# Patient Record
Sex: Female | Born: 1960
Health system: Southern US, Community
[De-identification: ages and names within clinical notes are randomized; demographics above are authoritative.]

## PROBLEM LIST (undated history)

## (undated) DIAGNOSIS — M170 Bilateral primary osteoarthritis of knee: Secondary | ICD-10-CM

## (undated) DIAGNOSIS — T4145XA Adverse effect of unspecified anesthetic, initial encounter: Secondary | ICD-10-CM

## (undated) DIAGNOSIS — T8859XA Other complications of anesthesia, initial encounter: Secondary | ICD-10-CM

## (undated) DIAGNOSIS — I456 Pre-excitation syndrome: Secondary | ICD-10-CM

## (undated) HISTORY — PX: COLONOSCOPY: SHX174

## (undated) HISTORY — DX: Bilateral primary osteoarthritis of knee: M17.0

## (undated) HISTORY — PX: DILATION AND CURETTAGE OF UTERUS: SHX78

## (undated) HISTORY — DX: Pre-excitation syndrome: I45.6

---

## 1999-03-23 ENCOUNTER — Emergency Department (HOSPITAL_COMMUNITY): Admission: EM | Admit: 1999-03-23 | Discharge: 1999-03-23 | Payer: Self-pay | Admitting: Emergency Medicine

## 2008-02-10 ENCOUNTER — Other Ambulatory Visit: Admission: RE | Admit: 2008-02-10 | Discharge: 2008-02-10 | Payer: Self-pay | Admitting: Family Medicine

## 2011-05-09 ENCOUNTER — Other Ambulatory Visit: Payer: Self-pay | Admitting: Family Medicine

## 2011-05-09 ENCOUNTER — Other Ambulatory Visit (HOSPITAL_COMMUNITY)
Admission: RE | Admit: 2011-05-09 | Discharge: 2011-05-09 | Disposition: A | Discharge status: Home / Self Care | Payer: 59 | Source: Ambulatory Visit | Attending: Family Medicine | Admitting: Family Medicine

## 2011-05-09 DIAGNOSIS — Z01419 Encounter for gynecological examination (general) (routine) without abnormal findings: Secondary | ICD-10-CM | POA: Insufficient documentation

## 2011-06-14 ENCOUNTER — Other Ambulatory Visit: Payer: Self-pay | Admitting: Family Medicine

## 2011-06-14 DIAGNOSIS — Z1231 Encounter for screening mammogram for malignant neoplasm of breast: Secondary | ICD-10-CM

## 2011-06-29 ENCOUNTER — Ambulatory Visit
Admission: RE | Admit: 2011-06-29 | Discharge: 2011-06-29 | Disposition: A | Discharge status: Home / Self Care | Payer: 59 | Source: Ambulatory Visit | Attending: Family Medicine | Admitting: Family Medicine

## 2011-06-29 DIAGNOSIS — Z1231 Encounter for screening mammogram for malignant neoplasm of breast: Secondary | ICD-10-CM

## 2011-06-30 ENCOUNTER — Other Ambulatory Visit: Payer: Self-pay | Admitting: Gastroenterology

## 2011-07-03 ENCOUNTER — Other Ambulatory Visit: Payer: Self-pay | Admitting: Family Medicine

## 2011-07-03 DIAGNOSIS — R928 Other abnormal and inconclusive findings on diagnostic imaging of breast: Secondary | ICD-10-CM

## 2015-05-21 ENCOUNTER — Other Ambulatory Visit: Payer: Self-pay | Admitting: Family Medicine

## 2015-05-21 ENCOUNTER — Other Ambulatory Visit (HOSPITAL_COMMUNITY)
Admission: RE | Admit: 2015-05-21 | Discharge: 2015-05-21 | Disposition: A | Discharge status: Home / Self Care | Payer: 59 | Source: Ambulatory Visit | Attending: Family Medicine | Admitting: Family Medicine

## 2015-05-21 DIAGNOSIS — Z01419 Encounter for gynecological examination (general) (routine) without abnormal findings: Secondary | ICD-10-CM | POA: Diagnosis not present

## 2015-05-24 LAB — CYTOLOGY - PAP

## 2016-03-18 DIAGNOSIS — Z1231 Encounter for screening mammogram for malignant neoplasm of breast: Secondary | ICD-10-CM | POA: Diagnosis not present

## 2016-04-04 DIAGNOSIS — R0982 Postnasal drip: Secondary | ICD-10-CM | POA: Diagnosis not present

## 2016-04-04 DIAGNOSIS — H93293 Other abnormal auditory perceptions, bilateral: Secondary | ICD-10-CM | POA: Diagnosis not present

## 2016-04-04 DIAGNOSIS — I456 Pre-excitation syndrome: Secondary | ICD-10-CM | POA: Diagnosis not present

## 2016-06-05 ENCOUNTER — Ambulatory Visit (INDEPENDENT_AMBULATORY_CARE_PROVIDER_SITE_OTHER): Payer: 59

## 2016-06-05 ENCOUNTER — Encounter (INDEPENDENT_AMBULATORY_CARE_PROVIDER_SITE_OTHER): Payer: Self-pay | Admitting: Orthopaedic Surgery

## 2016-06-05 ENCOUNTER — Encounter (INDEPENDENT_AMBULATORY_CARE_PROVIDER_SITE_OTHER): Payer: Self-pay

## 2016-06-05 ENCOUNTER — Ambulatory Visit (INDEPENDENT_AMBULATORY_CARE_PROVIDER_SITE_OTHER): Payer: 59 | Admitting: Orthopaedic Surgery

## 2016-06-05 DIAGNOSIS — M1711 Unilateral primary osteoarthritis, right knee: Secondary | ICD-10-CM

## 2016-06-05 MED ORDER — MELOXICAM 7.5 MG PO TABS: 7.5000 mg | ORAL_TABLET | Freq: Two times a day (BID) | ORAL | 2 refills | Status: DC | PRN

## 2016-06-05 NOTE — Progress Notes (Signed)
Office Visit Note   Patient: Kara Rodriguez           Date of Birth: 1960-10-13           MRN: [id number] Visit Date: 06/05/2016              Requested by: No referring provider defined for this encounter. PCP: No PCP Per Patient   Assessment & Plan: Visit Diagnoses:  1. Primary osteoarthritis of right knee     Plan: X-rays show advanced degenerative joint disease of the medial compartment mainly with a chronic PCL deficient knee. We discussed at length about her treatment options including Visco supplementation and total knee replacement. Sounds like her quality of life has significantly deteriorated she is leaning towards knee replacement later this year. For now she would like to try viscous supplementation. Modic was prescribed. Questions encouraged and answered. Total face to face encounter time was greater than 45 minutes and over half of this time was spent in counseling and/or coordination of care.  Follow-Up Instructions: Return if symptoms worsen or fail to improve.   Orders:  Orders Placed This Encounter  Procedures  . XR KNEE 3 VIEW RIGHT   Meds ordered this encounter  Medications  . meloxicam (MOBIC) 7.5 MG tablet    Sig: Take 1 tablet (7.5 mg total) by mouth 2 (two) times daily as needed for pain.    Dispense:  30 tablet    Refill:  2      Procedures: No procedures performed   Clinical Data: No additional findings.   Subjective: Chief Complaint  Patient presents with  . Right Knee - Pain    Patient is a was a 56 year old female who comes in with right medial knee pain that is severe for years. She had a cortisone injection about 3 months ago which helped for 3 weeks and now the pain is worse. She endorses cracking popping locking giving way and worsening pain with increased activity and significantly affecting her ADLs. She has taken Advil with partial relief. She has a prior skiing accident 15 years ago which did not require surgery.  She also has  Wolff-Parkinson-White syndrome    Review of Systems  Constitutional: Negative.   HENT: Negative.   Eyes: Negative.   Respiratory: Negative.   Cardiovascular: Negative.   Endocrine: Negative.   Musculoskeletal: Negative.   Neurological: Negative.   Hematological: Negative.   Psychiatric/Behavioral: Negative.   All other systems reviewed and are negative.    Objective: Vital Signs: There were no vitals taken for this visit.  Physical Exam  Constitutional: She is oriented to person, place, and time. She appears well-developed and well-nourished.  HENT:  Head: Normocephalic and atraumatic.  Eyes: EOM are normal.  Neck: Neck supple.  Pulmonary/Chest: Effort normal.  Abdominal: Soft.  Neurological: She is alert and oriented to person, place, and time.  Skin: Skin is warm. Capillary refill takes less than 2 seconds.  Psychiatric: She has a normal mood and affect. Her behavior is normal. Judgment and thought content normal.  Nursing note and vitals reviewed.   Ortho Exam Right knee exam shows no joint effusion. She has excellent range of motion. She does have medial joint line tenderness collaterals and cruciates are stable. She does have a posterior sag sign. Specialty Comments:  No specialty comments available.  Imaging: Xr Knee 3 View Right  Result Date: 06/05/2016 Advanced degenerative joint disease primarily in the medial compartment with chronic posterior subluxation of tibia consistent with chronic  PCL tear    PMFS History: There are no active problems to display for this patient.  No past medical history on file.  No family history on file.  No past surgical history on file. Social History   Occupational History  . Not on file.   Social History Main Topics  . Smoking status: Never Smoker  . Smokeless tobacco: Never Used  . Alcohol use Not on file  . Drug use: Unknown  . Sexual activity: Not on file

## 2016-06-07 ENCOUNTER — Telehealth (INDEPENDENT_AMBULATORY_CARE_PROVIDER_SITE_OTHER): Payer: Self-pay | Admitting: *Deleted

## 2016-06-07 NOTE — Telephone Encounter (Signed)
Pt asking if the pt can have Cortizone inj before she goes out of town tomorrow before the Synvisc inj.

## 2016-06-08 ENCOUNTER — Ambulatory Visit (INDEPENDENT_AMBULATORY_CARE_PROVIDER_SITE_OTHER): Payer: 59 | Admitting: Orthopedic Surgery

## 2016-06-08 ENCOUNTER — Ambulatory Visit (INDEPENDENT_AMBULATORY_CARE_PROVIDER_SITE_OTHER): Payer: 59 | Admitting: Orthopaedic Surgery

## 2016-06-08 DIAGNOSIS — M1711 Unilateral primary osteoarthritis, right knee: Secondary | ICD-10-CM

## 2016-06-08 NOTE — Telephone Encounter (Signed)
Was seen in our office today

## 2016-06-08 NOTE — Progress Notes (Signed)
   Office Visit Note   Patient: Kara Rodriguez           Date of Birth: 1960/10/02           MRN: [id number] Visit Date: 06/08/2016              Requested by: No referring provider defined for this encounter. PCP: No PCP Per Patient   Assessment & Plan: Visit Diagnoses:  1. Primary osteoarthritis of right knee     Plan: Right knee injection was performed without any immediate complications. Follow-up with me as needed. She may call if she desires discussed supplementation.  Follow-Up Instructions: Return if symptoms worsen or fail to improve.   Orders:  No orders of the defined types were placed in this encounter.  No orders of the defined types were placed in this encounter.     Procedures: Large Joint Inj Date/Time: 06/08/2016 11:34 AM Performed by: Leandrew Koyanagi Authorized by: Leandrew Koyanagi   Consent Given by:  Patient Timeout: prior to procedure the correct patient, procedure, and site was verified   Indications:  Pain Location:  Knee Site:  R knee Prep: patient was prepped and draped in usual sterile fashion   Needle Size:  22 G Ultrasound Guidance: No   Fluoroscopic Guidance: No   Arthrogram: No   Patient tolerance:  Patient tolerated the procedure well with no immediate complications     Clinical Data: No additional findings.   Subjective: Chief Complaint  Patient presents with  . Left Ankle - Pain    Patient follows up today for continued right knee pain. She is requesting injection. She continues to have right knee pain.    Review of Systems   Objective: Vital Signs: There were no vitals taken for this visit.  Physical Exam  Ortho Exam Right knee exam is stable. Specialty Comments:  No specialty comments available.  Imaging: No results found.   PMFS History: There are no active problems to display for this patient.  No past medical history on file.  No family history on file.  No past surgical history on file. Social History    Occupational History  . Not on file.   Social History Main Topics  . Smoking status: Never Smoker  . Smokeless tobacco: Never Used  . Alcohol use Not on file  . Drug use: Unknown  . Sexual activity: Not on file

## 2016-06-21 ENCOUNTER — Telehealth (INDEPENDENT_AMBULATORY_CARE_PROVIDER_SITE_OTHER): Payer: Self-pay | Admitting: Orthopaedic Surgery

## 2016-06-21 NOTE — Telephone Encounter (Signed)
Patient called advised she spoke with the monovisc rep and they advised an account need to be set up with them and more information is needed from the provider.  The number to contact patient is [phone]

## 2016-06-22 ENCOUNTER — Telehealth (INDEPENDENT_AMBULATORY_CARE_PROVIDER_SITE_OTHER): Payer: Self-pay | Admitting: Orthopaedic Surgery

## 2016-06-22 NOTE — Telephone Encounter (Signed)
Patient called advised [employer] need the enrollment form with their name listed. The fax# for Dannielle Karvonen is [phone]   The ph# for Dannielle Karvonen  Is [phone]

## 2016-06-22 NOTE — Telephone Encounter (Signed)
Form faxed tuesday

## 2016-06-22 NOTE — Telephone Encounter (Signed)
I will fill out form.

## 2016-06-26 ENCOUNTER — Telehealth (INDEPENDENT_AMBULATORY_CARE_PROVIDER_SITE_OTHER): Payer: Self-pay | Admitting: *Deleted

## 2016-06-26 NOTE — Telephone Encounter (Signed)
Pharmacy called this afternoon in regards to needing a prior-authorization for a medication for this patient. CB # [phone] option # 2. Thank you

## 2016-06-26 NOTE — Telephone Encounter (Signed)
Looks like this is a Dr. Erlinda Hong pt. The original message did not say what medication needs prior auth and I called the number x 3 and the line has been busy.

## 2016-06-26 NOTE — Telephone Encounter (Signed)
Form was faxed 4/9 & refaxed today 06/26/16

## 2016-06-27 DIAGNOSIS — M1711 Unilateral primary osteoarthritis, right knee: Secondary | ICD-10-CM | POA: Diagnosis not present

## 2016-06-27 NOTE — Telephone Encounter (Signed)
C[facility]ed [facility] to approve on shipment for Monovisc inj. Scheduled delivery for April 19 th

## 2016-07-03 ENCOUNTER — Ambulatory Visit (INDEPENDENT_AMBULATORY_CARE_PROVIDER_SITE_OTHER): Payer: 59 | Admitting: Orthopaedic Surgery

## 2016-07-03 DIAGNOSIS — M1711 Unilateral primary osteoarthritis, right knee: Secondary | ICD-10-CM

## 2016-07-03 MED ORDER — HYALURONAN 88 MG/4ML IX SOSY
88.0000 mg | PREFILLED_SYRINGE | INTRA_ARTICULAR | Status: AC | PRN
  Administered 2016-07-03: 88 mg via INTRA_ARTICULAR

## 2016-07-03 NOTE — Progress Notes (Signed)
   Procedure Note  Patient: Kara Rodriguez             Date of Birth: April 28, 1960           MRN: [id number]             Visit Date: 07/03/2016  Procedures: Visit Diagnoses: Primary osteoarthritis of right knee  Large Joint Inj Date/Time: 07/03/2016 4:41 PM Performed by: Leandrew Koyanagi Authorized by: Leandrew Koyanagi   Consent Given by:  Patient Timeout: prior to procedure the correct patient, procedure, and site was verified   Indications:  Pain Location:  Knee Site:  R knee Prep: patient was prepped and draped in usual sterile fashion   Needle Size:  22 G Approach:  Anterolateral Ultrasound Guidance: No   Fluoroscopic Guidance: No   Arthrogram: No   Medications:  88 mg Hyaluronan 88 MG/4ML

## 2016-07-06 ENCOUNTER — Telehealth (INDEPENDENT_AMBULATORY_CARE_PROVIDER_SITE_OTHER): Payer: Self-pay | Admitting: Orthopaedic Surgery

## 2016-07-06 NOTE — Telephone Encounter (Signed)
great

## 2016-07-06 NOTE — Telephone Encounter (Signed)
SEE MESSAGE

## 2016-07-06 NOTE — Telephone Encounter (Signed)
Pt called to inform that her knee is much more stable and feels much better because its more stable. Juluis Rainier  [phone]

## 2017-03-02 ENCOUNTER — Telehealth (INDEPENDENT_AMBULATORY_CARE_PROVIDER_SITE_OTHER): Payer: Self-pay | Admitting: Orthopaedic Surgery

## 2017-03-02 NOTE — Telephone Encounter (Signed)
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 She finally can go to [place] where the pt has to climb 10 to 8 story tower.Pt wanted to say thank to you and everyone in the office. Have a merry christmas and a happy new year

## 2017-03-02 NOTE — Telephone Encounter (Signed)
Made an error discard should go to [employer]

## 2017-03-07 NOTE — Telephone Encounter (Signed)
See message below °

## 2017-03-21 DIAGNOSIS — Z1231 Encounter for screening mammogram for malignant neoplasm of breast: Secondary | ICD-10-CM | POA: Diagnosis not present

## 2017-03-23 DIAGNOSIS — N889 Noninflammatory disorder of cervix uteri, unspecified: Secondary | ICD-10-CM | POA: Diagnosis not present

## 2017-03-23 DIAGNOSIS — N920 Excessive and frequent menstruation with regular cycle: Secondary | ICD-10-CM | POA: Diagnosis not present

## 2017-03-23 DIAGNOSIS — N760 Acute vaginitis: Secondary | ICD-10-CM | POA: Diagnosis not present

## 2017-04-25 ENCOUNTER — Other Ambulatory Visit: Payer: Self-pay | Admitting: Obstetrics & Gynecology

## 2017-04-25 DIAGNOSIS — N841 Polyp of cervix uteri: Secondary | ICD-10-CM | POA: Diagnosis not present

## 2017-04-26 ENCOUNTER — Telehealth (INDEPENDENT_AMBULATORY_CARE_PROVIDER_SITE_OTHER): Payer: Self-pay | Admitting: Orthopaedic Surgery

## 2017-04-26 NOTE — Telephone Encounter (Signed)
Submitted application for bilateral knee injection for [employer].

## 2017-04-26 NOTE — Telephone Encounter (Signed)
Patient called requesting bilateral monovisc injections in her knees, April talked to the patient and told her she would go ahead and submit the request for authorization. She was last seen in April 2018 for the injection in one of her knees. Now she would like both. FYI her appt is made for 05/21/17.

## 2017-05-01 ENCOUNTER — Telehealth (INDEPENDENT_AMBULATORY_CARE_PROVIDER_SITE_OTHER): Payer: Self-pay

## 2017-05-01 NOTE — Telephone Encounter (Signed)
Talked with Girard Cooter (pharmacist) with [facility] for Rx for bilateral knee Monovisc injection.

## 2017-05-01 NOTE — Telephone Encounter (Deleted)
Patient approved for SynviscOne injection left knee..  Authorized 04/26/17 - 04/26/2018.  Reference #[id number]

## 2017-05-18 ENCOUNTER — Telehealth (INDEPENDENT_AMBULATORY_CARE_PROVIDER_SITE_OTHER): Payer: Self-pay | Admitting: Orthopaedic Surgery

## 2017-05-18 ENCOUNTER — Telehealth (INDEPENDENT_AMBULATORY_CARE_PROVIDER_SITE_OTHER): Payer: Self-pay

## 2017-05-18 NOTE — Telephone Encounter (Signed)
Deleted Note due to an error

## 2017-05-18 NOTE — Telephone Encounter (Signed)
Message Sent in Error 

## 2017-05-18 NOTE — Telephone Encounter (Signed)
Called and left VM for patient to return my call concerning Monovisc Injection.

## 2017-05-18 NOTE — Telephone Encounter (Signed)
Please give patient a call back # [phone]

## 2017-05-18 NOTE — Telephone Encounter (Signed)
Talked with patient and advised her that a PA was needed for [employer].  Advised her that I had spoken with [employer] and that a form would be sent for me to complete and fax back.  PA form has been completed and faxed to [phone].

## 2017-05-21 ENCOUNTER — Ambulatory Visit (INDEPENDENT_AMBULATORY_CARE_PROVIDER_SITE_OTHER): Payer: 59 | Admitting: Orthopaedic Surgery

## 2017-05-21 ENCOUNTER — Telehealth (INDEPENDENT_AMBULATORY_CARE_PROVIDER_SITE_OTHER): Payer: Self-pay

## 2017-05-21 NOTE — Telephone Encounter (Signed)
Talked with patient and advised her that PA was denied due to product being excluded under the pharmacy benefit, but advised her that I talked with [facility] and they were needing her consent.  Gave patient number to call to give consent.  [phone]).

## 2017-05-22 ENCOUNTER — Telehealth (INDEPENDENT_AMBULATORY_CARE_PROVIDER_SITE_OTHER): Payer: Self-pay

## 2017-05-22 ENCOUNTER — Telehealth (INDEPENDENT_AMBULATORY_CARE_PROVIDER_SITE_OTHER): Payer: Self-pay | Admitting: Orthopaedic Surgery

## 2017-05-22 NOTE — Telephone Encounter (Signed)
Correction: It will be delivered lunch time Thursday March 14th

## 2017-05-22 NOTE — Telephone Encounter (Signed)
Talked with Verdene Lennert, nurse case manager with [facility] concerning patient's Monovisc injection.  Advised her that patient needed to call [facility] to give her consent for Monovisc.  Provided nurse case manager with number for [facility] which is [phone].

## 2017-05-22 NOTE — Telephone Encounter (Signed)
NOTED

## 2017-05-22 NOTE — Telephone Encounter (Signed)
Patients delivery for Monovisc will be here on March 20th around lunch.

## 2017-05-22 NOTE — Telephone Encounter (Signed)
Patient scheduled for Friday morning with Ria Comment

## 2017-05-23 DIAGNOSIS — M1711 Unilateral primary osteoarthritis, right knee: Secondary | ICD-10-CM | POA: Diagnosis not present

## 2017-05-25 ENCOUNTER — Ambulatory Visit (INDEPENDENT_AMBULATORY_CARE_PROVIDER_SITE_OTHER): Payer: 59 | Admitting: Physician Assistant

## 2017-05-25 ENCOUNTER — Encounter (INDEPENDENT_AMBULATORY_CARE_PROVIDER_SITE_OTHER): Payer: Self-pay | Admitting: Physician Assistant

## 2017-05-25 DIAGNOSIS — M1712 Unilateral primary osteoarthritis, left knee: Secondary | ICD-10-CM | POA: Diagnosis not present

## 2017-05-25 DIAGNOSIS — M1711 Unilateral primary osteoarthritis, right knee: Secondary | ICD-10-CM | POA: Diagnosis not present

## 2017-05-25 DIAGNOSIS — Z0000 Encounter for general adult medical examination without abnormal findings: Secondary | ICD-10-CM | POA: Diagnosis not present

## 2017-05-25 DIAGNOSIS — Z13220 Encounter for screening for lipoid disorders: Secondary | ICD-10-CM | POA: Diagnosis not present

## 2017-05-25 DIAGNOSIS — Z23 Encounter for immunization: Secondary | ICD-10-CM | POA: Diagnosis not present

## 2017-05-25 DIAGNOSIS — M170 Bilateral primary osteoarthritis of knee: Secondary | ICD-10-CM

## 2017-05-25 HISTORY — DX: Bilateral primary osteoarthritis of knee: M17.0

## 2017-05-25 MED ORDER — HYALURONAN 88 MG/4ML IX SOSY
88.0000 mg | PREFILLED_SYRINGE | INTRA_ARTICULAR | Status: AC | PRN
  Administered 2017-05-25: 88 mg via INTRA_ARTICULAR

## 2017-05-25 NOTE — Progress Notes (Signed)
   Procedure Note  Patient: Kara Rodriguez             Date of Birth: 1960/07/11           MRN: [id number]             Visit Date: 05/25/2017  Procedures: Visit Diagnoses: Primary localized osteoarthritis of knees, bilateral  Large Joint Inj: bilateral knee on 05/25/2017 8:32 AM Indications: pain Details: 22 G needle, anterolateral approach Medications (Right): 88 mg Hyaluronan 88 MG/4ML Medications (Left): 88 mg Hyaluronan 88 MG/4ML

## 2017-05-31 DIAGNOSIS — N760 Acute vaginitis: Secondary | ICD-10-CM | POA: Diagnosis not present

## 2017-05-31 DIAGNOSIS — N939 Abnormal uterine and vaginal bleeding, unspecified: Secondary | ICD-10-CM | POA: Diagnosis not present

## 2017-09-04 DIAGNOSIS — D126 Benign neoplasm of colon, unspecified: Secondary | ICD-10-CM | POA: Diagnosis not present

## 2017-09-04 DIAGNOSIS — Z86010 Personal history of colonic polyps: Secondary | ICD-10-CM | POA: Diagnosis not present

## 2017-09-06 DIAGNOSIS — D126 Benign neoplasm of colon, unspecified: Secondary | ICD-10-CM | POA: Diagnosis not present

## 2017-10-19 ENCOUNTER — Telehealth (INDEPENDENT_AMBULATORY_CARE_PROVIDER_SITE_OTHER): Payer: Self-pay | Admitting: Orthopaedic Surgery

## 2017-10-19 NOTE — Telephone Encounter (Signed)
Patient was inquiring about receiving bilateral knee monovisc injections. Her last was done on 3/15. Her husbands insurance is renewing 9/1 so she wants to know if she could get them again before that time. Please advise # [phone]

## 2017-10-19 NOTE — Telephone Encounter (Signed)
I called and spoke with patient. She understands that she cannot have the injections until at least 11/25/17.  She would like to proceed with injections as soon as she is able.   Patient also states that we need to contact [employer] in [place] when we work on authorization. She explains that we went through [place] internationally last time and they could not navigate our phone system to even get in touch with [place] so that she could have the injections.

## 2017-10-22 NOTE — Telephone Encounter (Signed)
Noted  

## 2017-10-22 NOTE — Telephone Encounter (Signed)
See message.

## 2017-10-24 ENCOUNTER — Telehealth (INDEPENDENT_AMBULATORY_CARE_PROVIDER_SITE_OTHER): Payer: Self-pay

## 2017-10-24 NOTE — Telephone Encounter (Signed)
Submitted for VOB, Monovisc, bilateral knee.

## 2017-10-30 ENCOUNTER — Telehealth (INDEPENDENT_AMBULATORY_CARE_PROVIDER_SITE_OTHER): Payer: Self-pay

## 2017-10-30 NOTE — Telephone Encounter (Signed)
Submitted Rx form through [employer] to obtain through [facility] under medical benefits.

## 2017-11-01 ENCOUNTER — Telehealth (INDEPENDENT_AMBULATORY_CARE_PROVIDER_SITE_OTHER): Payer: Self-pay

## 2017-11-01 NOTE — Telephone Encounter (Signed)
Faxed Rx for Monovsic, bilateral knee to [employer] at [phone].

## 2017-11-02 ENCOUNTER — Telehealth (INDEPENDENT_AMBULATORY_CARE_PROVIDER_SITE_OTHER): Payer: Self-pay | Admitting: Orthopaedic Surgery

## 2017-11-02 NOTE — Telephone Encounter (Signed)
Patient also left message saying we need to contact [employer]. She wanted to make sure we had her patient number with [facility] when we call them. Patient #[id number]  [facility] # [phone]

## 2017-11-02 NOTE — Telephone Encounter (Signed)
Returned call to [facility], but was advised that I would receive a call back from the pharmacists later today, 11/02/2017 or Monday, 11/05/2017 concerning Rx for Monovisc injection, bilateral knee.

## 2017-11-02 NOTE — Telephone Encounter (Signed)
Luis from [employer] called stating that they need to authenticate Dr. Phoebe Sharps signature since they received an application for authorization for Monovisc.  CB#[phone].  Thank you.

## 2017-11-02 NOTE — Telephone Encounter (Signed)
Duplicate

## 2017-11-06 ENCOUNTER — Telehealth (INDEPENDENT_AMBULATORY_CARE_PROVIDER_SITE_OTHER): Payer: Self-pay

## 2017-11-06 DIAGNOSIS — M170 Bilateral primary osteoarthritis of knee: Secondary | ICD-10-CM | POA: Diagnosis not present

## 2017-11-06 NOTE — Telephone Encounter (Signed)
Kara Rodriguez with [facility] called stating that Rx for Monovisc, bilateral knee will be delivered on Friday, 11/09/2017.

## 2017-11-06 NOTE — Telephone Encounter (Signed)
Patient is approved for Monovisc, bilateral knee Obtained through [facility] Patient will be responsible for 20% OOP No PA required No Co-pay  Talked with patient and advised her that Rx for Monovisc, bilateral knee will be delivered on Friday, 11/09/2017 per Gerald Stabs with [facility]. Appt.scheduled 11/27/2017

## 2017-11-27 ENCOUNTER — Ambulatory Visit (INDEPENDENT_AMBULATORY_CARE_PROVIDER_SITE_OTHER): Payer: 59 | Admitting: Orthopaedic Surgery

## 2017-11-27 DIAGNOSIS — M170 Bilateral primary osteoarthritis of knee: Secondary | ICD-10-CM

## 2017-11-27 HISTORY — DX: Bilateral primary osteoarthritis of knee: M17.0

## 2017-11-27 MED ORDER — BUPIVACAINE HCL 0.25 % IJ SOLN
0.6600 mL | INTRAMUSCULAR | Status: AC | PRN
  Administered 2017-11-27: .66 mL via INTRA_ARTICULAR

## 2017-11-27 MED ORDER — LIDOCAINE HCL 1 % IJ SOLN
3.0000 mL | INTRAMUSCULAR | Status: AC | PRN
  Administered 2017-11-27: 3 mL

## 2017-11-27 MED ORDER — BUPIVACAINE HCL 0.25 % IJ SOLN
0.6600 mL | INTRAMUSCULAR | Status: AC | PRN
Start: 2017-11-27 — End: 2017-11-27
  Administered 2017-11-27: .66 mL via INTRA_ARTICULAR

## 2017-11-27 MED ORDER — HYALURONAN 88 MG/4ML IX SOSY
88.0000 mg | PREFILLED_SYRINGE | INTRA_ARTICULAR | Status: AC | PRN
  Administered 2017-11-27: 88 mg via INTRA_ARTICULAR

## 2017-11-27 NOTE — Progress Notes (Signed)
   Procedure Note  Patient: Kara Rodriguez             Date of Birth: [birth year]           MRN: [id number]             Visit Date: 11/27/2017  Procedures: Visit Diagnoses: Bilateral primary osteoarthritis of knee - Plan: Large Joint Inj: bilateral knee  Large Joint Inj: bilateral knee on 11/27/2017 10:25 AM Indications: pain Details: 22 G needle, anterolateral approach Medications (Right): 0.66 mL bupivacaine 0.25 %; 3 mL lidocaine 1 %; 88 mg Hyaluronan 88 MG/4ML Medications (Left): 0.66 mL bupivacaine 0.25 %; 3 mL lidocaine 1 %; 88 mg Hyaluronan 88 MG/4ML

## 2018-01-22 ENCOUNTER — Encounter (INDEPENDENT_AMBULATORY_CARE_PROVIDER_SITE_OTHER): Payer: Self-pay | Admitting: Orthopaedic Surgery

## 2018-01-22 ENCOUNTER — Ambulatory Visit (INDEPENDENT_AMBULATORY_CARE_PROVIDER_SITE_OTHER): Payer: 59 | Admitting: Orthopaedic Surgery

## 2018-01-22 ENCOUNTER — Ambulatory Visit (INDEPENDENT_AMBULATORY_CARE_PROVIDER_SITE_OTHER): Payer: Self-pay

## 2018-01-22 DIAGNOSIS — M1711 Unilateral primary osteoarthritis, right knee: Secondary | ICD-10-CM

## 2018-01-22 DIAGNOSIS — M25562 Pain in left knee: Secondary | ICD-10-CM | POA: Diagnosis not present

## 2018-01-22 DIAGNOSIS — I456 Pre-excitation syndrome: Secondary | ICD-10-CM | POA: Diagnosis not present

## 2018-01-22 NOTE — Progress Notes (Signed)
Office Visit Note   Patient: Kara Rodriguez           Date of Birth: 1960/10/23           MRN: [id number] Visit Date: 01/22/2018              Requested by: No referring provider defined for this encounter. PCP: Patient, No Pcp Per   Assessment & Plan: Visit Diagnoses:  1. Primary osteoarthritis of right knee   2. WPW syndrome     Plan: Unfortunately patient has end-stage tricompartmental degenerative joint disease with her right knee.  She is very symptomatic and has failed conservative treatment.  She would like to proceed with a right total knee replacement after full discussion of risks and benefits and rehab and recovery.  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 Wynonia Rodriguez but I believe he has since retired.  We will make a new referral to Dr. Johnsie Rodriguez for preoperative cardiac clearance.  She is considering having the right knee replaced in late January.  Follow-Up Instructions: Return if symptoms worsen or fail to improve.   Orders:  Orders Placed This Encounter  Procedures  . XR Knee Complete 4 Views Left  . XR Knee Complete 4 Views Right  . Ambulatory referral to Cardiology   No orders of the defined types were placed in this encounter.     Procedures: No procedures performed   Clinical Data: No additional findings.   Subjective: Chief Complaint  Patient presents with  . Left Knee - Pain  . Right Knee - Pain    Ashby Dawes is a very pleasant 57 year old female comes in with right greater than left knee pain.  She has been getting Visco supplementation injections in the last year.  However the last injection did not give her any significant relief.  She is here to explore other options.   Review of Systems  Constitutional: Negative.   HENT: Negative.   Eyes: Negative.   Respiratory: Negative.   Cardiovascular: Negative.   Endocrine: Negative.   Musculoskeletal: Negative.   Neurological: Negative.     Hematological: Negative.   Psychiatric/Behavioral: Negative.   All other systems reviewed and are negative.    Objective: Vital Signs: There were no vitals taken for this visit.  Physical Exam  Constitutional: She is oriented to person, place, and time. She appears well-developed and well-nourished.  Pulmonary/Chest: Effort normal.  Neurological: She is alert and oriented to person, place, and time.  Skin: Skin is warm. Capillary refill takes less than 2 seconds.  Psychiatric: She has a normal mood and affect. Her behavior is normal. Judgment and thought content normal.  Nursing note and vitals reviewed.   Ortho Exam Bilateral knee exam shows no joint effusion.  Collaterals and cruciates are stable. Specialty Comments:  No specialty comments available.  Imaging: Xr Knee Complete 4 Views Left  Result Date: 01/22/2018 Advanced degenerative joint disease of the right knee with bone-on-bone joint space narrowing of the medial compartment.  Xr Knee Complete 4 Views Right  Result Date: 01/22/2018 Advanced degenerative joint disease with bone-on-bone joint space narrowing    PMFS History: Patient Active Problem List   Diagnosis Date Noted  . Bilateral primary osteoarthritis of knee 11/27/2017  . Primary localized osteoarthritis of knees, bilateral 05/25/2017   History reviewed. No pertinent past medical history.  History reviewed. No pertinent family history.  History reviewed. No pertinent surgical history. Social History   Occupational History  .  Not on file  Tobacco Use  . Smoking status: Never Smoker  . Smokeless tobacco: Never Used  Substance and Sexual Activity  . Alcohol use: Not on file  . Drug use: Not on file  . Sexual activity: Not on file

## 2018-01-24 ENCOUNTER — Other Ambulatory Visit: Payer: Self-pay

## 2018-01-24 NOTE — Progress Notes (Signed)
Cardiology Office Note   Date:  01/25/2018   ID:  Kara Rodriguez, DOB 09-16-1960, MRN [id number]  PCP:  Patient, No Pcp Per  Cardiologist:   Jenkins Rouge, MD   No chief complaint on file.     History of Present Illness: Kara Rodriguez is a 57 y.o. female who presents for consultation regarding pre operative clearance.  She needs a right TKR with Dr Erlinda Hong. She is a patient of Dr Thurman Coyer She has a history of WPW  Reviewed Dr Thurman Coyer note from 2017 First diagnosed With ? WPW age 62. Stopped taking digoxin in her 20's Has self limited episodes Seen a few years ago by Dr Caryl Comes in [place] and Converted with adenosine Not Rx with beta blocker and declined referral to EP  No known structural heart disease and his Note indicated her baseline ECG is not currently pre excited   Active no symptoms A bit of a thrill seeker Talked a lot about her recent trip [place] No chest pain dyspnea palpitations or syncope Typically if she were to have high HR a vagal maneuver abolishes it   Past Medical History:  Diagnosis Date  . Bilateral primary osteoarthritis of knee 11/27/2017  . Primary localized osteoarthritis of knees, bilateral 05/25/2017  . WPW (Wolff-Parkinson-White syndrome)     History reviewed. No pertinent surgical history.   No current outpatient medications on file.   No current facility-administered medications for this visit.     Allergies:   Flonase [fluticasone propionate] and Miconazole    Social History:  The patient  reports that she has never smoked. She has never used smokeless tobacco. She reports that she drinks about 2.0 standard drinks of alcohol per week. She reports that she does not use drugs.   Family History:  The patient's family history includes Cancer (age of onset: 36) in her son; Emphysema in her paternal grandfather; Healthy in her brother, daughter, and daughter; Heart attack (age of onset: 26) in her mother; Non-Hodgkin's lymphoma in her  father; Other in her maternal grandfather, maternal grandmother, other, and paternal grandmother.    ROS:  Please see the history of present illness.   Otherwise, review of systems are positive for none.   All other systems are reviewed and negative.    PHYSICAL EXAM: VS:  BP 120/78   Pulse 72   Ht 5\' 7"  (1.702 m)   Wt 252 lb 4 oz (114.4 kg)   SpO2 97%   BMI 39.51 kg/m  , BMI Body mass index is 39.51 kg/m. Affect appropriate Healthy:  appears stated age 69: normal Neck supple with no adenopathy JVP normal no bruits no thyromegaly Lungs clear with no wheezing and good diaphragmatic motion Heart:  S1/S2 no murmur, no rub, gallop or click PMI normal Abdomen: benighn, BS positve, no tenderness, no AAA no bruit.  No HSM or HJR Distal pulses intact with no bruits No edema Neuro non-focal Skin warm and dry No muscular weakness    EKG:  NSR rate 68 normal no pre excitation   Recent Labs: No results found for requested labs within last 8760 hours.    Lipid Panel No results found for: CHOL, TRIG, HDL, CHOLHDL, VLDL, LDLCALC, LDLDIRECT    Wt Readings from Last 3 Encounters:  01/25/18 252 lb 4 oz (114.4 kg)      Other studies Reviewed: Additional studies/ records that were reviewed today include: Notes from DR Erlinda Hong orthopedics Notes Dr Wynonia Lawman cardiology .  ASSESSMENT AND PLAN:  1.  Pre operative clearance:clear to have surgery on knee with no further evaluation  2.  WPW not manifest ECG normal observe    Current medicines are reviewed at length with the patient today.  The patient does not have concerns regarding medicines.  The following changes have been made:  no change  Labs/ tests ordered today include: None   Orders Placed This Encounter  Procedures  . EKG 12-Lead     Disposition:   FU with EP in a year      Signed, Jenkins Rouge, MD  01/25/2018 11:40 AM    [facility] [address], [place], [place]  [address] Phone:  [phone]; Fax: [phone]

## 2018-01-25 ENCOUNTER — Encounter: Payer: Self-pay | Admitting: Cardiovascular Disease

## 2018-01-25 ENCOUNTER — Ambulatory Visit (INDEPENDENT_AMBULATORY_CARE_PROVIDER_SITE_OTHER): Payer: 59 | Admitting: Cardiovascular Disease

## 2018-01-25 VITALS — BP 120/78 | HR 72 | Ht 67.0 in | Wt 252.2 lb

## 2018-01-25 DIAGNOSIS — I456 Pre-excitation syndrome: Secondary | ICD-10-CM

## 2018-01-25 DIAGNOSIS — Z01818 Encounter for other preprocedural examination: Secondary | ICD-10-CM | POA: Diagnosis not present

## 2018-01-25 NOTE — Patient Instructions (Signed)
Medication Instructions:   If you need a refill on your cardiac medications before your next appointment, please call your pharmacy.   Lab work:  If you have labs (blood work) drawn today and your tests are completely normal, you will receive your results only by: Marland Kitchen MyChart Message (if you have MyChart) OR . A paper copy in the mail If you have any lab test that is abnormal or we need to change your treatment, we will call you to review the results.  Testing/Procedures: NONE ordered at this time.  Follow-Up: At [facility], you and your health needs are our priority.  As part of our continuing mission to provide you with exceptional heart care, we have created designated Provider Care Teams.  These Care Teams include your primary Cardiologist (physician) and Advanced Practice Providers (APPs -  Physician Assistants and Nurse Practitioners) who all work together to provide you with the care you need, when you need it. Your physician wants you to follow-up in: 1 year with Dr. Caryl Comes. You will receive a reminder letter in the mail two months in advance. If you don't receive a letter, please call our office to schedule the follow-up appointment.

## 2018-03-14 NOTE — Pre-Procedure Instructions (Signed)
Kara Rodriguez  03/14/2018      [facility] [place], [place] AT [facility] OF [address] [place] [address] Phone: [phone] Fax: [phone]    Your procedure is scheduled on January 6th.  Report to [facility] Admitting at 1:00 P.M.  Call this number if you have problems the morning of surgery:  [phone]   Remember:  Do not eat or drink after midnight.    Take these medicines the morning of surgery with A SIP OF WATER  acetaminophen (TYLENOL) if needed  7 days prior to surgery STOP taking any Aspirin (unless otherwise instructed by your surgeon), Aleve, Naproxen, Ibuprofen, Motrin, Advil, Goody's, BC's, all herbal medications, fish oil, and all vitamins.     Do not wear jewelry, make-up or nail polish.  Do not wear lotions, powders, or perfumes, or deodorant.  Do not shave 48 hours prior to surgery.  Men may shave face and neck.  Do not bring valuables to the hospital.  [facility] is not responsible for any belongings or valuables.  Contacts, dentures or bridgework may not be worn into surgery.  Leave your suitcase in the car.  After surgery it may be brought to your room.  For patients admitted to the hospital, discharge time will be determined by your treatment team.  Patients discharged the day of surgery will not be allowed to drive home.    [facility]- Preparing For Surgery  Before surgery, you can play an important role. Because skin is not sterile, your skin needs to be as free of germs as possible. You can reduce the number of germs on your skin by washing with CHG (chlorahexidine gluconate) Soap before surgery.  CHG is an antiseptic cleaner which kills germs and bonds with the skin to continue killing germs even after washing.    Oral Hygiene is also important to reduce your risk of infection.  Remember - BRUSH YOUR TEETH THE MORNING OF SURGERY WITH YOUR REGULAR  TOOTHPASTE  Please do not use if you have an allergy to CHG or antibacterial soaps. If your skin becomes reddened/irritated stop using the CHG.  Do not shave (including legs and underarms) for at least 48 hours prior to first CHG shower. It is OK to shave your face.  Please follow these instructions carefully.   1. Shower the NIGHT BEFORE SURGERY and the MORNING OF SURGERY with CHG.   2. If you chose to wash your hair, wash your hair first as usual with your normal shampoo.  3. After you shampoo, rinse your hair and body thoroughly to remove the shampoo.  4. Use CHG as you would any other liquid soap. You can apply CHG directly to the skin and wash gently with a scrungie or a clean washcloth.   5. Apply the CHG Soap to your body ONLY FROM THE NECK DOWN.  Do not use on open wounds or open sores. Avoid contact with your eyes, ears, mouth and genitals (private parts). Wash Face and genitals (private parts)  with your normal soap.  6. Wash thoroughly, paying special attention to the area where your surgery will be performed.  7. Thoroughly rinse your body with warm water from the neck down.  8. DO NOT shower/wash with your normal soap after using and rinsing off the CHG Soap.  9. Pat yourself dry with a CLEAN TOWEL.  10. Wear CLEAN PAJAMAS to bed the night before  surgery, wear comfortable clothes the morning of surgery  11. Place CLEAN SHEETS on your bed the night of your first shower and DO NOT SLEEP WITH PETS.    Day of Surgery:  Do not apply any deodorants/lotions.  Please wear clean clothes to the hospital/surgery center.   Remember to brush your teeth WITH YOUR REGULAR TOOTHPASTE.    Please read over the following fact sheets that you were given.

## 2018-03-14 NOTE — Pre-Procedure Instructions (Signed)
Kara Rodriguez  03/14/2018    Your procedure is scheduled on Monday, March 18, 2018 at 3:15 PM.   Report to [facility] Entrance "A" Admitting Office at 1:15 PM.   Call this number if you have problems the morning of surgery: [phone]   Remember:  Do not eat or drink after midnight Sunday, 03/17/2018  Take these medicines the morning of surgery with A SIP OF WATER: Tylenol - if needed    Do not wear jewelry, make-up or nail polish.  Do not wear lotions, powders, perfumes or deodorant.  Do not shave 48 hours prior to surgery.    Do not bring valuables to the hospital.  [facility] is not responsible for any belongings or valuables.  Contacts, dentures or bridgework may not be worn into surgery.  Leave your suitcase in the car.  After surgery it may be brought to your room.  For patients admitted to the hospital, discharge time will be determined by your treatment team.  [facility] - Preparing for Surgery  Before surgery, you can play an important role.  Because skin is not sterile, your skin needs to be as free of germs as possible.  You can reduce the number of germs on you skin by washing with CHG (chlorahexidine gluconate) soap before surgery.  CHG is an antiseptic cleaner which kills germs and bonds with the skin to continue killing germs even after washing.  Oral Hygiene is also important in reducing the risk of infection.  Remember to brush your teeth with your regular toothpaste the morning of surgery.  Please DO NOT use if you have an allergy to CHG or antibacterial soaps.  If your skin becomes reddened/irritated stop using the CHG and inform your nurse when you arrive at Short Stay.  Do not shave (including legs and underarms) for at least 48 hours prior to the first CHG shower.  You may shave your face.  Please follow these instructions carefully:   1.  Shower with CHG Soap the night before surgery and the morning of Surgery.  2.  If you choose to wash your  hair, wash your hair first as usual with your normal shampoo.  3.  After you shampoo, rinse your hair and body thoroughly to remove the shampoo. 4.  Use CHG as you would any other liquid soap.  You can apply chg directly to the skin and wash gently with a      scrungie or washcloth.           5.  Apply the CHG Soap to your body ONLY FROM THE NECK DOWN.   Do not use on open wounds or open sores. Avoid contact with your eyes, ears, mouth and genitals (private parts).  Wash genitals (private parts) with your normal soap.  6.  Wash thoroughly, paying special attention to the area where your surgery will be performed.  7.  Thoroughly rinse your body with warm water from the neck down.  8.  DO NOT shower/wash with your normal soap after using and rinsing off the CHG Soap.  9.  Pat yourself dry with a clean towel.            10.  Wear clean pajamas.            11 .  Place clean sheets on your bed the night of your first shower and do not sleep with pets.  Day of Surgery  Shower as above. Do not apply any lotions/deodorants the  morning of surgery.   Please wear clean clothes to the hospital. Remember to brush your teeth with toothpaste.   Please read over the fact sheets that you were given.

## 2018-03-15 ENCOUNTER — Encounter (HOSPITAL_COMMUNITY)
Admission: RE | Admit: 2018-03-15 | Discharge: 2018-03-15 | Disposition: A | Discharge status: Home / Self Care | Payer: 59 | Source: Ambulatory Visit | Attending: Orthopaedic Surgery | Admitting: Orthopaedic Surgery

## 2018-03-15 ENCOUNTER — Ambulatory Visit (HOSPITAL_COMMUNITY)
Admission: RE | Admit: 2018-03-15 | Discharge: 2018-03-15 | Disposition: A | Discharge status: Home / Self Care | Payer: 59 | Source: Ambulatory Visit | Attending: Physician Assistant | Admitting: Physician Assistant

## 2018-03-15 ENCOUNTER — Encounter (HOSPITAL_COMMUNITY): Payer: Self-pay

## 2018-03-15 ENCOUNTER — Other Ambulatory Visit: Payer: Self-pay

## 2018-03-15 DIAGNOSIS — Z01818 Encounter for other preprocedural examination: Secondary | ICD-10-CM | POA: Diagnosis not present

## 2018-03-15 DIAGNOSIS — M1711 Unilateral primary osteoarthritis, right knee: Secondary | ICD-10-CM

## 2018-03-15 HISTORY — DX: Other complications of anesthesia, initial encounter: T88.59XA

## 2018-03-15 HISTORY — DX: Adverse effect of unspecified anesthetic, initial encounter: T41.45XA

## 2018-03-15 LAB — CBC WITH DIFFERENTIAL/PLATELET
Abs Immature Granulocytes: 0.03 10*3/uL (ref 0.00–0.07)
Basophils Absolute: 0 10*3/uL (ref 0.0–0.1)
Basophils Relative: 1 %
Eosinophils Absolute: 0.1 10*3/uL (ref 0.0–0.5)
Eosinophils Relative: 1 %
HCT: 45.3 % (ref 36.0–46.0)
Hemoglobin: 14.4 g/dL (ref 12.0–15.0)
Immature Granulocytes: 1 %
Lymphocytes Relative: 35 %
Lymphs Abs: 2.3 10*3/uL (ref 0.7–4.0)
MCH: 29.6 pg (ref 26.0–34.0)
MCHC: 31.8 g/dL (ref 30.0–36.0)
MCV: 93.2 fL (ref 80.0–100.0)
Monocytes Absolute: 0.4 10*3/uL (ref 0.1–1.0)
Monocytes Relative: 6 %
NEUTROS PCT: 56 %
Neutro Abs: 3.6 10*3/uL (ref 1.7–7.7)
Platelets: 188 10*3/uL (ref 150–400)
RBC: 4.86 MIL/uL (ref 3.87–5.11)
RDW: 13.3 % (ref 11.5–15.5)
WBC: 6.5 10*3/uL (ref 4.0–10.5)
nRBC: 0 % (ref 0.0–0.2)

## 2018-03-15 LAB — COMPREHENSIVE METABOLIC PANEL
ALT: 26 U/L (ref 0–44)
AST: 20 U/L (ref 15–41)
Albumin: 4.1 g/dL (ref 3.5–5.0)
Alkaline Phosphatase: 54 U/L (ref 38–126)
Anion gap: 10 (ref 5–15)
BUN: 9 mg/dL (ref 6–20)
CO2: 26 mmol/L (ref 22–32)
Calcium: 9.6 mg/dL (ref 8.9–10.3)
Chloride: 103 mmol/L (ref 98–111)
Creatinine, Ser: 0.83 mg/dL (ref 0.44–1.00)
GFR calc Af Amer: >60 mL/min (ref >60)
GFR calc non Af Amer: >60 mL/min (ref >60)
Glucose, Bld: 101 mg/dL — ABNORMAL HIGH (ref 70–99)
Potassium: 4 mmol/L (ref 3.5–5.1)
Sodium: 139 mmol/L (ref 135–145)
Total Bilirubin: 0.7 mg/dL (ref 0.3–1.2)
Total Protein: 7.7 g/dL (ref 6.5–8.1)

## 2018-03-15 LAB — PROTIME-INR
INR: 0.96
PROTHROMBIN TIME: 12.7 s (ref 11.4–15.2)

## 2018-03-15 LAB — ABO/RH: ABO/RH(D): B POS

## 2018-03-15 LAB — TYPE AND SCREEN
ABO/RH(D): B POS
Antibody Screen: NEGATIVE

## 2018-03-15 LAB — SURGICAL PCR SCREEN
MRSA, PCR: NEGATIVE
Staphylococcus aureus: NEGATIVE

## 2018-03-15 LAB — APTT: aPTT: 29 seconds (ref 24–36)

## 2018-03-15 MED ORDER — TRANEXAMIC ACID-NACL 1000-0.7 MG/100ML-% IV SOLN
1000.0000 mg | INTRAVENOUS | Status: AC
  Administered 2018-03-18: 1000 mg via INTRAVENOUS
  Filled 2018-03-15: qty 100

## 2018-03-15 MED ORDER — TRANEXAMIC ACID 1000 MG/10ML IV SOLN
2000.0000 mg | INTRAVENOUS | Status: AC
  Administered 2018-03-18: 2000 mg via TOPICAL
  Filled 2018-03-15: qty 20

## 2018-03-15 NOTE — Progress Notes (Signed)
PCP - Dr. Cruzita Lederer  Cardiologist - Dr. Johnsie Cancel  Chest x-ray - 03/15/2018  EKG - 01/25/18 (E)  Stress Test - Denies  ECHO - Denies  Cardiac Cath - Denies  AICD- na PM- na LOOP- na  Sleep Study - Denies CPAP - None  LABS- 03/15/2018: CBC w/D, CMP, PT, PTT, T/S, PCR 03/18/2018: POC UPreg  ASA- Denies   Anesthesia- No  Pt denies having chest pain, sob, or fever at this time. All instructions explained to the pt, with a verbal understanding of the material. Pt agrees to go over the instructions while at home for a better understanding. The opportunity to ask questions was provided.

## 2018-03-17 ENCOUNTER — Encounter (HOSPITAL_COMMUNITY): Payer: Self-pay | Admitting: Anesthesiology

## 2018-03-17 NOTE — Anesthesia Preprocedure Evaluation (Addendum)
Anesthesia Evaluation  Patient identified by MRN, date of birth, ID band Patient awake    Reviewed: Allergy & Precautions, NPO status , Patient's Chart, lab work & pertinent test results  Airway Mallampati: II       Dental no notable dental hx. (+) Teeth Intact   Pulmonary neg pulmonary ROS,    Pulmonary exam normal breath sounds clear to auscultation       Cardiovascular Normal cardiovascular exam Rhythm:Regular Rate:Normal     Neuro/Psych negative neurological ROS  negative psych ROS   GI/Hepatic negative GI ROS, Neg liver ROS,   Endo/Other  Morbid obesity  Renal/GU negative Renal ROS  negative genitourinary   Musculoskeletal  (+) Arthritis , Osteoarthritis,    Abdominal (+) + obese,   Peds  Hematology negative hematology ROS (+)   Anesthesia Other Findings   Reproductive/Obstetrics                             Anesthesia Physical Anesthesia Plan  ASA: III  Anesthesia Plan: Spinal   Post-op Pain Management:  Regional for Post-op pain   Induction:   PONV Risk Score and Plan: 2 and Ondansetron and Dexamethasone  Airway Management Planned: Nasal Cannula, Natural Airway and Simple Face Mask  Additional Equipment:   Intra-op Plan:   Post-operative Plan:   Informed Consent: I have reviewed the patients History and Physical, chart, labs and discussed the procedure including the risks, benefits and alternatives for the proposed anesthesia with the patient or authorized representative who has indicated his/her understanding and acceptance.     Plan Discussed with: CRNA and Surgeon  Anesthesia Plan Comments:        Anesthesia Quick Evaluation

## 2018-03-18 ENCOUNTER — Observation Stay (HOSPITAL_COMMUNITY): Payer: 59

## 2018-03-18 ENCOUNTER — Inpatient Hospital Stay (HOSPITAL_COMMUNITY)
Admission: RE | Admit: 2018-03-18 | Discharge: 2018-03-19 | DRG: 470 | Disposition: A | Discharge status: Home / Self Care | Payer: 59 | Attending: Orthopaedic Surgery | Admitting: Orthopaedic Surgery

## 2018-03-18 ENCOUNTER — Encounter (HOSPITAL_COMMUNITY)
Admission: RE | Disposition: A | Discharge status: Home / Self Care | Payer: Self-pay | Source: Home / Self Care | Attending: Orthopaedic Surgery

## 2018-03-18 ENCOUNTER — Other Ambulatory Visit: Payer: Self-pay

## 2018-03-18 ENCOUNTER — Inpatient Hospital Stay (HOSPITAL_COMMUNITY): Payer: 59 | Admitting: Certified Registered Nurse Anesthetist

## 2018-03-18 ENCOUNTER — Encounter (HOSPITAL_COMMUNITY): Payer: Self-pay

## 2018-03-18 DIAGNOSIS — M170 Bilateral primary osteoarthritis of knee: Secondary | ICD-10-CM | POA: Diagnosis not present

## 2018-03-18 DIAGNOSIS — Z888 Allergy status to other drugs, medicaments and biological substances status: Secondary | ICD-10-CM

## 2018-03-18 DIAGNOSIS — Z96651 Presence of right artificial knee joint: Secondary | ICD-10-CM | POA: Diagnosis not present

## 2018-03-18 DIAGNOSIS — M1711 Unilateral primary osteoarthritis, right knee: Secondary | ICD-10-CM | POA: Diagnosis not present

## 2018-03-18 DIAGNOSIS — M25761 Osteophyte, right knee: Secondary | ICD-10-CM | POA: Diagnosis not present

## 2018-03-18 DIAGNOSIS — Z8249 Family history of ischemic heart disease and other diseases of the circulatory system: Secondary | ICD-10-CM

## 2018-03-18 DIAGNOSIS — Z8679 Personal history of other diseases of the circulatory system: Secondary | ICD-10-CM

## 2018-03-18 DIAGNOSIS — Z471 Aftercare following joint replacement surgery: Secondary | ICD-10-CM | POA: Diagnosis not present

## 2018-03-18 DIAGNOSIS — Z96659 Presence of unspecified artificial knee joint: Secondary | ICD-10-CM

## 2018-03-18 DIAGNOSIS — G8918 Other acute postprocedural pain: Secondary | ICD-10-CM | POA: Diagnosis not present

## 2018-03-18 DIAGNOSIS — Z807 Family history of other malignant neoplasms of lymphoid, hematopoietic and related tissues: Secondary | ICD-10-CM | POA: Diagnosis not present

## 2018-03-18 DIAGNOSIS — Z825 Family history of asthma and other chronic lower respiratory diseases: Secondary | ICD-10-CM

## 2018-03-18 HISTORY — PX: TOTAL KNEE ARTHROPLASTY: SHX125

## 2018-03-18 LAB — POCT PREGNANCY, URINE: Preg Test, Ur: NEGATIVE

## 2018-03-18 SURGERY — ARTHROPLASTY, KNEE, TOTAL
Anesthesia: Spinal | Site: Knee | Laterality: Right

## 2018-03-18 MED ORDER — METHOCARBAMOL 1000 MG/10ML IJ SOLN
500.0000 mg | Freq: Four times a day (QID) | INTRAVENOUS | Status: DC | PRN
  Filled 2018-03-18: qty 5

## 2018-03-18 MED ORDER — CLONIDINE HCL (ANALGESIA) 100 MCG/ML EP SOLN
EPIDURAL | Status: DC | PRN
  Administered 2018-03-18: 100 ug

## 2018-03-18 MED ORDER — MIDAZOLAM HCL 5 MG/5ML IJ SOLN
INTRAMUSCULAR | Status: DC | PRN
  Administered 2018-03-18 (×2): 1 mg via INTRAVENOUS

## 2018-03-18 MED ORDER — CEFAZOLIN SODIUM-DEXTROSE 2-4 GM/100ML-% IV SOLN
2.0000 g | Freq: Three times a day (TID) | INTRAVENOUS | Status: DC
  Administered 2018-03-18 – 2018-03-19 (×2): 2 g via INTRAVENOUS
  Filled 2018-03-18 (×3): qty 100

## 2018-03-18 MED ORDER — SODIUM CHLORIDE 0.9 % IV SOLN
INTRAVENOUS | Status: DC | PRN
  Administered 2018-03-18: 50 ug/min via INTRAVENOUS
  Administered 2018-03-18: 25 ug/min via INTRAVENOUS

## 2018-03-18 MED ORDER — FENTANYL CITRATE (PF) 250 MCG/5ML IJ SOLN
INTRAMUSCULAR | Status: AC
  Filled 2018-03-18: qty 5

## 2018-03-18 MED ORDER — ASPIRIN 81 MG PO CHEW
81.0000 mg | CHEWABLE_TABLET | Freq: Two times a day (BID) | ORAL | Status: DC
  Administered 2018-03-18 – 2018-03-19 (×2): 81 mg via ORAL
  Filled 2018-03-18 (×2): qty 1

## 2018-03-18 MED ORDER — HYDROMORPHONE HCL 1 MG/ML IJ SOLN: 0.5000 mg | INTRAMUSCULAR | Status: DC | PRN

## 2018-03-18 MED ORDER — DEXAMETHASONE SODIUM PHOSPHATE 10 MG/ML IJ SOLN
INTRAMUSCULAR | Status: DC | PRN
  Administered 2018-03-18: 10 mg via INTRAVENOUS

## 2018-03-18 MED ORDER — METHOCARBAMOL 750 MG PO TABS: 750.0000 mg | ORAL_TABLET | Freq: Two times a day (BID) | ORAL | 0 refills | Status: DC | PRN

## 2018-03-18 MED ORDER — OXYCODONE HCL 5 MG PO TABS: 5.0000 mg | ORAL_TABLET | ORAL | 0 refills | Status: DC | PRN

## 2018-03-18 MED ORDER — CHLORHEXIDINE GLUCONATE 4 % EX LIQD: 60.0000 mL | Freq: Once | CUTANEOUS | Status: DC

## 2018-03-18 MED ORDER — METOCLOPRAMIDE HCL 5 MG PO TABS: 5.0000 mg | ORAL_TABLET | Freq: Three times a day (TID) | ORAL | Status: DC | PRN

## 2018-03-18 MED ORDER — POLYETHYLENE GLYCOL 3350 17 G PO PACK: 17.0000 g | PACK | Freq: Every day | ORAL | Status: DC | PRN

## 2018-03-18 MED ORDER — SODIUM CHLORIDE 0.9 % IV SOLN
INTRAVENOUS | Status: DC
  Administered 2018-03-19: 05:00:00 via INTRAVENOUS

## 2018-03-18 MED ORDER — ROPIVACAINE HCL 5 MG/ML IJ SOLN
INTRAMUSCULAR | Status: DC | PRN
  Administered 2018-03-18 (×2): 5 mL via PERINEURAL

## 2018-03-18 MED ORDER — SORBITOL 70 % SOLN: 30.0000 mL | Freq: Every day | Status: DC | PRN

## 2018-03-18 MED ORDER — VANCOMYCIN HCL 1000 MG IV SOLR
INTRAVENOUS | Status: DC | PRN
  Administered 2018-03-18: 1000 mg

## 2018-03-18 MED ORDER — ALUM & MAG HYDROXIDE-SIMETH 200-200-20 MG/5ML PO SUSP: 30.0000 mL | ORAL | Status: DC | PRN

## 2018-03-18 MED ORDER — KETOROLAC TROMETHAMINE 15 MG/ML IJ SOLN
30.0000 mg | Freq: Four times a day (QID) | INTRAMUSCULAR | Status: DC
  Administered 2018-03-19 (×3): 30 mg via INTRAVENOUS
  Filled 2018-03-18 (×3): qty 2

## 2018-03-18 MED ORDER — PROPOFOL 500 MG/50ML IV EMUL
INTRAVENOUS | Status: DC | PRN
  Administered 2018-03-18: 75 ug/kg/min via INTRAVENOUS

## 2018-03-18 MED ORDER — METOCLOPRAMIDE HCL 5 MG/ML IJ SOLN: 5.0000 mg | Freq: Three times a day (TID) | INTRAMUSCULAR | Status: DC | PRN

## 2018-03-18 MED ORDER — CELECOXIB 200 MG PO CAPS
200.0000 mg | ORAL_CAPSULE | Freq: Two times a day (BID) | ORAL | Status: DC
  Administered 2018-03-18 – 2018-03-19 (×2): 200 mg via ORAL
  Filled 2018-03-18 (×2): qty 1

## 2018-03-18 MED ORDER — ASPIRIN EC 81 MG PO TBEC: 81.0000 mg | DELAYED_RELEASE_TABLET | Freq: Two times a day (BID) | ORAL | 0 refills | Status: DC

## 2018-03-18 MED ORDER — ONDANSETRON HCL 4 MG/2ML IJ SOLN
INTRAMUSCULAR | Status: DC | PRN
  Administered 2018-03-18: 4 mg via INTRAVENOUS

## 2018-03-18 MED ORDER — SENNOSIDES-DOCUSATE SODIUM 8.6-50 MG PO TABS: 1.0000 | ORAL_TABLET | Freq: Every evening | ORAL | 1 refills | Status: DC | PRN

## 2018-03-18 MED ORDER — ROPIVACAINE HCL 7.5 MG/ML IJ SOLN
INTRAMUSCULAR | Status: DC | PRN
  Administered 2018-03-18 (×4): 5 mL via PERINEURAL

## 2018-03-18 MED ORDER — ONDANSETRON HCL 4 MG PO TABS: 4.0000 mg | ORAL_TABLET | Freq: Four times a day (QID) | ORAL | Status: DC | PRN

## 2018-03-18 MED ORDER — HYDROMORPHONE HCL 1 MG/ML IJ SOLN
INTRAMUSCULAR | Status: AC
  Administered 2018-03-18: 0.5 mg via INTRAVENOUS
  Filled 2018-03-18: qty 1

## 2018-03-18 MED ORDER — OXYCODONE HCL 5 MG PO TABS
10.0000 mg | ORAL_TABLET | ORAL | Status: DC | PRN
  Administered 2018-03-18: 15 mg via ORAL

## 2018-03-18 MED ORDER — DEXAMETHASONE SODIUM PHOSPHATE 10 MG/ML IJ SOLN
10.0000 mg | Freq: Once | INTRAMUSCULAR | Status: AC
  Administered 2018-03-19: 10 mg via INTRAVENOUS
  Filled 2018-03-18: qty 1

## 2018-03-18 MED ORDER — MIDAZOLAM HCL 2 MG/2ML IJ SOLN
INTRAMUSCULAR | Status: AC
  Filled 2018-03-18: qty 2

## 2018-03-18 MED ORDER — LIDOCAINE HCL (CARDIAC) PF 100 MG/5ML IV SOSY
PREFILLED_SYRINGE | INTRAVENOUS | Status: DC | PRN
  Administered 2018-03-18: 40 mg via INTRAVENOUS

## 2018-03-18 MED ORDER — ACETAMINOPHEN 500 MG PO TABS
1000.0000 mg | ORAL_TABLET | Freq: Four times a day (QID) | ORAL | Status: DC
  Administered 2018-03-19 (×2): 1000 mg via ORAL
  Administered 2018-03-19: 500 mg via ORAL
  Filled 2018-03-18 (×3): qty 2

## 2018-03-18 MED ORDER — PROPOFOL 10 MG/ML IV BOLUS
INTRAVENOUS | Status: DC | PRN
  Administered 2018-03-18: 30 mg via INTRAVENOUS

## 2018-03-18 MED ORDER — LACTATED RINGERS IV SOLN
INTRAVENOUS | Status: DC
  Administered 2018-03-18: 12:00:00 via INTRAVENOUS

## 2018-03-18 MED ORDER — METHOCARBAMOL 500 MG PO TABS
500.0000 mg | ORAL_TABLET | Freq: Four times a day (QID) | ORAL | Status: DC | PRN
  Administered 2018-03-18: 500 mg via ORAL

## 2018-03-18 MED ORDER — OXYCODONE HCL 5 MG PO TABS
ORAL_TABLET | ORAL | Status: AC
  Administered 2018-03-18: 15 mg via ORAL
  Filled 2018-03-18: qty 3

## 2018-03-18 MED ORDER — HYDROMORPHONE HCL 1 MG/ML IJ SOLN
0.2500 mg | INTRAMUSCULAR | Status: DC | PRN
  Administered 2018-03-18 (×2): 0.5 mg via INTRAVENOUS

## 2018-03-18 MED ORDER — OXYCODONE HCL ER 10 MG PO T12A: 10.0000 mg | EXTENDED_RELEASE_TABLET | Freq: Two times a day (BID) | ORAL | 0 refills | Status: DC

## 2018-03-18 MED ORDER — SULFAMETHOXAZOLE-TRIMETHOPRIM 800-160 MG PO TABS: 1.0000 | ORAL_TABLET | Freq: Two times a day (BID) | ORAL | 0 refills | Status: DC

## 2018-03-18 MED ORDER — CEFAZOLIN SODIUM-DEXTROSE 2-4 GM/100ML-% IV SOLN
INTRAVENOUS | Status: AC
  Filled 2018-03-18: qty 100

## 2018-03-18 MED ORDER — BUPIVACAINE LIPOSOME 1.3 % IJ SUSP
20.0000 mL | INTRAMUSCULAR | Status: AC
  Administered 2018-03-18: 20 mL
  Filled 2018-03-18: qty 20

## 2018-03-18 MED ORDER — ONDANSETRON HCL 4 MG/2ML IJ SOLN
4.0000 mg | Freq: Four times a day (QID) | INTRAMUSCULAR | Status: DC | PRN
  Administered 2018-03-18: 4 mg via INTRAVENOUS
  Filled 2018-03-18: qty 2

## 2018-03-18 MED ORDER — BUPIVACAINE IN DEXTROSE 0.75-8.25 % IT SOLN
INTRATHECAL | Status: DC | PRN
  Administered 2018-03-18: 1.6 mL via INTRATHECAL

## 2018-03-18 MED ORDER — SODIUM CHLORIDE 0.9% FLUSH
INTRAVENOUS | Status: DC | PRN
  Administered 2018-03-18: 40 mL

## 2018-03-18 MED ORDER — MIDAZOLAM HCL 2 MG/2ML IJ SOLN
INTRAMUSCULAR | Status: AC
  Administered 2018-03-18: 2 mg
  Filled 2018-03-18: qty 2

## 2018-03-18 MED ORDER — MEPERIDINE HCL 50 MG/ML IJ SOLN: 6.2500 mg | INTRAMUSCULAR | Status: DC | PRN

## 2018-03-18 MED ORDER — DIPHENHYDRAMINE HCL 12.5 MG/5ML PO ELIX: 25.0000 mg | ORAL_SOLUTION | ORAL | Status: DC | PRN

## 2018-03-18 MED ORDER — 0.9 % SODIUM CHLORIDE (POUR BTL) OPTIME
TOPICAL | Status: DC | PRN
  Administered 2018-03-18: 1000 mL

## 2018-03-18 MED ORDER — KETOROLAC TROMETHAMINE 30 MG/ML IJ SOLN
30.0000 mg | Freq: Once | INTRAMUSCULAR | Status: AC | PRN
  Administered 2018-03-18: 30 mg via INTRAVENOUS

## 2018-03-18 MED ORDER — PROMETHAZINE HCL 25 MG/ML IJ SOLN: 6.2500 mg | INTRAMUSCULAR | Status: DC | PRN

## 2018-03-18 MED ORDER — CEFAZOLIN SODIUM-DEXTROSE 2-4 GM/100ML-% IV SOLN
2.0000 g | INTRAVENOUS | Status: AC
  Administered 2018-03-18: 2 g via INTRAVENOUS

## 2018-03-18 MED ORDER — KETOROLAC TROMETHAMINE 30 MG/ML IJ SOLN
INTRAMUSCULAR | Status: AC
  Administered 2018-03-18: 30 mg via INTRAVENOUS
  Filled 2018-03-18: qty 1

## 2018-03-18 MED ORDER — SODIUM CHLORIDE 0.9 % IR SOLN
Status: DC | PRN
  Administered 2018-03-18 (×2): 3000 mL

## 2018-03-18 MED ORDER — ONDANSETRON HCL 4 MG PO TABS: 4.0000 mg | ORAL_TABLET | Freq: Three times a day (TID) | ORAL | 0 refills | Status: DC | PRN

## 2018-03-18 MED ORDER — DOCUSATE SODIUM 100 MG PO CAPS: 100.0000 mg | ORAL_CAPSULE | Freq: Two times a day (BID) | ORAL | Status: DC

## 2018-03-18 MED ORDER — GABAPENTIN 300 MG PO CAPS
300.0000 mg | ORAL_CAPSULE | Freq: Three times a day (TID) | ORAL | Status: DC
  Administered 2018-03-18 – 2018-03-19 (×2): 300 mg via ORAL
  Filled 2018-03-18 (×2): qty 1

## 2018-03-18 MED ORDER — VANCOMYCIN HCL 1000 MG IV SOLR
INTRAVENOUS | Status: AC
  Filled 2018-03-18: qty 1000

## 2018-03-18 MED ORDER — METHOCARBAMOL 500 MG PO TABS
ORAL_TABLET | ORAL | Status: AC
  Administered 2018-03-18: 500 mg via ORAL
  Filled 2018-03-18: qty 1

## 2018-03-18 MED ORDER — PROMETHAZINE HCL 25 MG PO TABS: 25.0000 mg | ORAL_TABLET | Freq: Four times a day (QID) | ORAL | 1 refills | Status: DC | PRN

## 2018-03-18 MED ORDER — TRANEXAMIC ACID-NACL 1000-0.7 MG/100ML-% IV SOLN
1000.0000 mg | Freq: Once | INTRAVENOUS | Status: AC
  Administered 2018-03-18: 1000 mg via INTRAVENOUS
  Filled 2018-03-18: qty 100

## 2018-03-18 MED ORDER — OXYCODONE HCL ER 10 MG PO T12A: 10.0000 mg | EXTENDED_RELEASE_TABLET | Freq: Two times a day (BID) | ORAL | Status: DC

## 2018-03-18 MED ORDER — OXYCODONE HCL 5 MG PO TABS: 5.0000 mg | ORAL_TABLET | ORAL | Status: DC | PRN

## 2018-03-18 MED ORDER — ACETAMINOPHEN 325 MG PO TABS: 325.0000 mg | ORAL_TABLET | Freq: Four times a day (QID) | ORAL | Status: DC | PRN

## 2018-03-18 MED ORDER — FENTANYL CITRATE (PF) 100 MCG/2ML IJ SOLN
INTRAMUSCULAR | Status: AC
  Administered 2018-03-18: 100 ug
  Filled 2018-03-18: qty 2

## 2018-03-18 MED ORDER — PHENOL 1.4 % MT LIQD: 1.0000 | OROMUCOSAL | Status: DC | PRN

## 2018-03-18 MED ORDER — MAGNESIUM CITRATE PO SOLN: 1.0000 | Freq: Once | ORAL | Status: DC | PRN

## 2018-03-18 MED ORDER — MENTHOL 3 MG MT LOZG: 1.0000 | LOZENGE | OROMUCOSAL | Status: DC | PRN

## 2018-03-18 SURGICAL SUPPLY — 83 items
ALCOHOL ISOPROPYL (RUBBING) (MISCELLANEOUS) ×2 IMPLANT
BAG DECANTER FOR FLEXI CONT (MISCELLANEOUS) ×2 IMPLANT
BANDAGE ESMARK 6X9 LF (GAUZE/BANDAGES/DRESSINGS) ×1 IMPLANT
BLADE SAW SGTL 13.0X1.19X90.0M (BLADE) ×2 IMPLANT
BNDG CMPR 9X6 STRL LF SNTH (GAUZE/BANDAGES/DRESSINGS) ×1
BNDG CMPR MED 10X6 ELC LF (GAUZE/BANDAGES/DRESSINGS) ×1
BNDG CMPR MED 15X6 ELC VLCR LF (GAUZE/BANDAGES/DRESSINGS) ×1
BNDG ELASTIC 6X10 VLCR STRL LF (GAUZE/BANDAGES/DRESSINGS) ×2 IMPLANT
BNDG ELASTIC 6X15 VLCR STRL LF (GAUZE/BANDAGES/DRESSINGS) ×1 IMPLANT
BNDG ESMARK 6X9 LF (GAUZE/BANDAGES/DRESSINGS) ×2
BOWL SMART MIX CTS (DISPOSABLE) ×2 IMPLANT
BSPLAT TIB 5D E CMNT KN RT (Knees) ×1 IMPLANT
CEMENT BONE R 1X40 (Cement) ×2 IMPLANT
CLSR STERI-STRIP ANTIMIC 1/2X4 (GAUZE/BANDAGES/DRESSINGS) ×4 IMPLANT
COVER SURGICAL LIGHT HANDLE (MISCELLANEOUS) ×2 IMPLANT
COVER WAND RF STERILE (DRAPES) ×2 IMPLANT
CUFF TOURNIQUET SINGLE 34IN LL (TOURNIQUET CUFF) ×2 IMPLANT
CUFF TOURNIQUET SINGLE 44IN (TOURNIQUET CUFF) IMPLANT
DRAPE EXTREMITY T 121X128X90 (DISPOSABLE) ×2 IMPLANT
DRAPE HALF SHEET 40X57 (DRAPES) ×2 IMPLANT
DRAPE INCISE IOBAN 66X45 STRL (DRAPES) IMPLANT
DRAPE ORTHO SPLIT 77X108 STRL (DRAPES) ×4
DRAPE POUCH INSTRU U-SHP 10X18 (DRAPES) ×2 IMPLANT
DRAPE SURG ORHT 6 SPLT 77X108 (DRAPES) ×2 IMPLANT
DRAPE U-SHAPE 47X51 STRL (DRAPES) ×4 IMPLANT
DRILL PIN HEADLESS TROCAR 3X75 (PIN) ×1 IMPLANT
DURAPREP 26ML APPLICATOR (WOUND CARE) ×5 IMPLANT
ELECT CAUTERY BLADE 6.4 (BLADE) ×2 IMPLANT
ELECT REM PT RETURN 9FT ADLT (ELECTROSURGICAL) ×2
ELECTRODE REM PT RTRN 9FT ADLT (ELECTROSURGICAL) ×1 IMPLANT
FEMUR CMT CR STD SZ 6 RT KNEE (Joint) ×2 IMPLANT
FEMUR CMTD CR STD SZ 6 RT KNEE (Joint) IMPLANT
GAUZE SPONGE 4X4 12PLY STRL (GAUZE/BANDAGES/DRESSINGS) ×1 IMPLANT
GAUZE SPONGE 4X4 12PLY STRL LF (GAUZE/BANDAGES/DRESSINGS) ×2 IMPLANT
GLOVE BIO SURGEON STRL SZ 6.5 (GLOVE) ×6 IMPLANT
GLOVE BIOGEL PI IND STRL 7.0 (GLOVE) ×1 IMPLANT
GLOVE BIOGEL PI IND STRL 7.5 (GLOVE) IMPLANT
GLOVE BIOGEL PI INDICATOR 7.0 (GLOVE) ×2
GLOVE BIOGEL PI INDICATOR 7.5 (GLOVE) ×1
GLOVE ECLIPSE 7.0 STRL STRAW (GLOVE) ×6 IMPLANT
GLOVE SKINSENSE NS SZ7.5 (GLOVE) ×1
GLOVE SKINSENSE STRL SZ7.5 (GLOVE) ×1 IMPLANT
GLOVE SURG SYN 7.5  E (GLOVE) ×4
GLOVE SURG SYN 7.5 E (GLOVE) ×4 IMPLANT
GLOVE SURG SYN 7.5 PF PI (GLOVE) ×4 IMPLANT
GOWN STRL REIN XL XLG (GOWN DISPOSABLE) ×2 IMPLANT
GOWN STRL REUS W/ TWL LRG LVL3 (GOWN DISPOSABLE) ×1 IMPLANT
GOWN STRL REUS W/TWL LRG LVL3 (GOWN DISPOSABLE) ×2
HANDPIECE INTERPULSE COAX TIP (DISPOSABLE) ×2
HOOD PEEL AWAY FLYTE STAYCOOL (MISCELLANEOUS) ×4 IMPLANT
INSERT TIB ARTISURF SZ6-7 R 11 (Joint) ×1 IMPLANT
KIT BASIN OR (CUSTOM PROCEDURE TRAY) ×2 IMPLANT
KIT TURNOVER KIT B (KITS) ×2 IMPLANT
MANIFOLD NEPTUNE II (INSTRUMENTS) ×2 IMPLANT
MARKER SKIN DUAL TIP RULER LAB (MISCELLANEOUS) ×2 IMPLANT
NDL SPNL 18GX3.5 QUINCKE PK (NEEDLE) ×2 IMPLANT
NEEDLE SPNL 18GX3.5 QUINCKE PK (NEEDLE) ×4 IMPLANT
NS IRRIG 1000ML POUR BTL (IV SOLUTION) ×2 IMPLANT
PACK TOTAL JOINT (CUSTOM PROCEDURE TRAY) ×2 IMPLANT
PAD ABD 8X10 STRL (GAUZE/BANDAGES/DRESSINGS) ×4 IMPLANT
PAD ARMBOARD 7.5X6 YLW CONV (MISCELLANEOUS) ×4 IMPLANT
PADDING CAST COTTON 6X4 STRL (CAST SUPPLIES) ×2 IMPLANT
SAW OSC TIP CART 19.5X105X1.3 (SAW) ×2 IMPLANT
SET HNDPC FAN SPRY TIP SCT (DISPOSABLE) ×1 IMPLANT
STAPLER VISISTAT 35W (STAPLE) IMPLANT
STEM POLY PAT PLY 32M KNEE (Knees) ×1 IMPLANT
STEM TIBIA 5 DEG SZ E R KNEE (Knees) IMPLANT
SUCTION FRAZIER HANDLE 10FR (MISCELLANEOUS) ×1
SUCTION TUBE FRAZIER 10FR DISP (MISCELLANEOUS) ×1 IMPLANT
SUT ETHILON 2 0 FS 18 (SUTURE) ×1 IMPLANT
SUT MNCRL AB 4-0 PS2 18 (SUTURE) IMPLANT
SUT VIC AB 0 CT1 27 (SUTURE) ×4
SUT VIC AB 0 CT1 27XBRD ANBCTR (SUTURE) ×2 IMPLANT
SUT VIC AB 1 CTX 27 (SUTURE) ×6 IMPLANT
SUT VIC AB 2-0 CT1 27 (SUTURE) ×4
SUT VIC AB 2-0 CT1 TAPERPNT 27 (SUTURE) ×4 IMPLANT
SYR 50ML LL SCALE MARK (SYRINGE) ×4 IMPLANT
TIBIA STEM 5 DEG SZ E R KNEE (Knees) ×2 IMPLANT
TOWEL OR 17X24 6PK STRL BLUE (TOWEL DISPOSABLE) ×2 IMPLANT
TOWEL OR 17X26 10 PK STRL BLUE (TOWEL DISPOSABLE) ×2 IMPLANT
TRAY CATH 16FR W/PLASTIC CATH (SET/KITS/TRAYS/PACK) ×1 IMPLANT
UNDERPAD 30X30 (UNDERPADS AND DIAPERS) ×2 IMPLANT
WRAP KNEE MAXI GEL POST OP (GAUZE/BANDAGES/DRESSINGS) ×2 IMPLANT

## 2018-03-18 NOTE — Discharge Instructions (Signed)

## 2018-03-18 NOTE — Anesthesia Procedure Notes (Signed)
Spinal  Patient location during procedure: OR Start time: 03/18/2018 2:20 PM End time: 03/18/2018 2:25 PM Staffing Anesthesiologist: Lyn Hollingshead, MD Performed: anesthesiologist  Preanesthetic Checklist Completed: patient identified, site marked, surgical consent, pre-op evaluation, timeout performed, IV checked, risks and benefits discussed and monitors and equipment checked Spinal Block Patient position: sitting Prep: site prepped and draped and DuraPrep Patient monitoring: continuous pulse ox and blood pressure Approach: midline Location: L3-4 Injection technique: single-shot Needle Needle type: Pencan  Needle gauge: 24 G Needle length: 10 cm Needle insertion depth: 7 cm Assessment Sensory level: T4 Events: paresthesia Additional Notes L leg X 1. Injected only after patient denied any ongoing discomfort. Injection was without incident.

## 2018-03-18 NOTE — Transfer of Care (Signed)
Immediate Anesthesia Transfer of Care Note  Patient: Kara Rodriguez  Procedure(s) Performed: RIGHT TOTAL KNEE ARTHROPLASTY (Right Knee)  Patient Location: PACU  Anesthesia Type:MAC, Regional and Spinal  Level of Consciousness: drowsy and patient cooperative  Airway & Oxygen Therapy: Patient Spontanous Breathing and Patient connected to face mask oxygen  Post-op Assessment: Report given to RN and Post -op Vital signs reviewed and stable  Post vital signs: Reviewed and stable  Last Vitals:  Vitals Value Taken Time  BP 136/85 03/18/2018  4:39 PM  Temp 36.5 C 03/18/2018  4:39 PM  Pulse 58 03/18/2018  4:44 PM  Resp 20 03/18/2018  4:44 PM  SpO2 91 % 03/18/2018  4:44 PM  Vitals shown include unvalidated device data.  Last Pain:  Vitals:   03/18/18 1639  TempSrc:   PainSc: 0-No pain         Complications: No apparent anesthesia complications

## 2018-03-18 NOTE — H&P (Signed)
PREOPERATIVE H&P  Chief Complaint: right knee degenerative joint disease  HPI: Kara Rodriguez is a 58 y.o. female who presents for surgical treatment of right knee degenerative joint disease.  She denies any changes in medical history.  Past Medical History:  Diagnosis Date  . Bilateral primary osteoarthritis of knee 11/27/2017  . Complication of anesthesia    Pt sts she has to be careful when taking pain meds because it speeds up her heart rate.  . Primary localized osteoarthritis of knees, bilateral 05/25/2017  . WPW (Wolff-Parkinson-White syndrome)    Past Surgical History:  Procedure Laterality Date  . COLONOSCOPY    . DILATION AND CURETTAGE OF UTERUS     Social History   Socioeconomic History  . Marital status: Married    Spouse name: Not on file  . Number of children: Not on file  . Years of education: Not on file  . Highest education level: Not on file  Occupational History  . Not on file  Social Needs  . Financial resource strain: Not on file  . Food insecurity:    Worry: Not on file    Inability: Not on file  . Transportation needs:    Medical: Not on file    Non-medical: Not on file  Tobacco Use  . Smoking status: Never Smoker  . Smokeless tobacco: Never Used  Substance and Sexual Activity  . Alcohol use: Yes    Alcohol/week: 2.0 standard drinks    Types: 2 Glasses of wine per week    Frequency: Never  . Drug use: Never  . Sexual activity: Not on file    Comment: MARRIED  Lifestyle  . Physical activity:    Days per week: Not on file    Minutes per session: Not on file  . Stress: Not on file  Relationships  . Social connections:    Talks on phone: Not on file    Gets together: Not on file    Attends religious service: Not on file    Active member of club or organization: Not on file    Attends meetings of clubs or organizations: Not on file    Relationship status: Not on file  Other Topics Concern  . Not on file  Social History Narrative    . Not on file   Family History  Problem Relation Age of Onset  . Heart attack Mother 70  . Non-Hodgkin's lymphoma Father   . Healthy Brother   . Other Maternal Grandmother        RAN AWAY  . Other Maternal Grandfather        RUN OVER BY STEAM ROLLER  . Other Paternal Grandmother        FAILURE TO THRIVE  . Emphysema Paternal Grandfather   . Other Other        MOTHER 'S PATERNAL UNCLE FROZEN TO DEATH  . Cancer Son 74       TESTICULAR  . Healthy Daughter   . Healthy Daughter    Allergies  Allergen Reactions  . Flonase [Fluticasone Propionate] Other (See Comments)    Makes her joints hurt   . Miconazole Rash   Prior to Admission medications   Medication Sig Start Date End Date Taking? Authorizing Provider  acetaminophen (TYLENOL) 500 MG tablet Take 500 mg by mouth every 6 (six) hours.   Yes [provider]     Positive ROS: All other systems have been reviewed and were otherwise negative with the exception of  those mentioned in the HPI and as above.  Physical Exam: General: Alert, no acute distress Cardiovascular: No pedal edema Respiratory: No cyanosis, no use of accessory musculature GI: abdomen soft Skin: No lesions in the area of chief complaint Neurologic: Sensation intact distally Psychiatric: Patient is competent for consent with normal mood and affect Lymphatic: no lymphedema  MUSCULOSKELETAL: exam stable  Assessment: right knee degenerative joint disease  Plan: Plan for Procedure(s): RIGHT TOTAL KNEE ARTHROPLASTY  The risks benefits and alternatives were discussed with the patient including but not limited to the risks of nonoperative treatment, versus surgical intervention including infection, bleeding, nerve injury,  blood clots, cardiopulmonary complications, morbidity, mortality, among others, and they were willing to proceed.   Preoperative templating of the joint replacement has been completed, documented, and submitted to the Operating  Room personnel in order to optimize intra-operative equipment management.  Patient's anticipated LOS is less than 2 midnights, meeting these requirements: - Younger than 92 - Lives within 1 hour of care - Has a competent adult at home to recover with post-op recover - NO history of  - Chronic pain requiring opiods  - Diabetes  - Coronary Artery Disease  - Heart failure  - Heart attack  - Stroke  - DVT/VTE  - Cardiac arrhythmia  - Respiratory Failure/COPD  - Renal failure  - Anemia  - Advanced Liver disease  Eduard Roux, MD   03/18/2018 7:28 AM

## 2018-03-18 NOTE — Op Note (Signed)
Total Knee Arthroplasty Procedure Note  Preoperative diagnosis: Right knee osteoarthritis  Postoperative diagnosis:same  Operative procedure: Right total knee arthroplasty. CPT [id number]  Surgeon: N. Eduard Roux, MD  Assist: April Green, RNFA  Anesthesia: Spinal, regional  Tourniquet time: 60 mins  Implants used: Zimmer persona Femur: CR 6 Tibia: E Patella: 32 mm Polyethylene: 11 mm medial congruent  Indication: Kara Rodriguez is a 58 y.o. year old female with a history of knee pain. Having failed conservative management, the patient elected to proceed with a total knee arthroplasty.  We have reviewed the risk and benefits of the surgery and they elected to proceed after voicing understanding.  Procedure:  After informed consent was obtained and understanding of the risk were voiced including but not limited to bleeding, infection, damage to surrounding structures including nerves and vessels, blood clots, leg length inequality and the failure to achieve desired results, the operative extremity was marked with verbal confirmation of the patient in the holding area.   The patient was then brought to the operating room and transported to the operating room table in the supine position.  A tourniquet was applied to the operative extremity around the upper thigh. The operative limb was then prepped and draped in the usual sterile fashion and preoperative antibiotics were administered.  A time out was performed prior to the start of surgery confirming the correct extremity, preoperative antibiotic administration, as well as team members, implants and instruments available for the case. Correct surgical site was also confirmed with preoperative radiographs. The limb was then elevated for exsanguination and the tourniquet was inflated. A midline incision was made and a standard medial parapatellar approach was performed.  The patella was prepared and sized to a 32 mm.  A cover was placed on the  patella for protection from retractors.  We then turned our attention to the femur. Posterior cruciate ligament was sacrificed. Start site was drilled in the femur and the intramedullary distal femoral cutting guide was placed, set at 5 degrees valgus, taking 10 mm of distal resection. The distal cut was made. Osteophytes were then removed. Next, the proximal tibial cutting guide was placed with appropriate slope, varus/valgus alignment and depth of resection. The proximal tibial cut was made. Gap blocks were then used to assess the extension gap and alignment, and appropriate soft tissue releases were performed. Attention was turned back to the femur, which was sized using the sizing guide to a size 6. Appropriate rotation of the femoral component was determined using epicondylar axis, Whiteside's line, and assessing the flexion gap under ligament tension. The appropriate size 4-in-1 cutting block was placed and cuts were made. Posterior femoral osteophytes and uncapped bone were then removed with the curved osteotome. The tibia was sized for a size E component.  The femoral lugs were drilled. Trial components were placed, and stability was checked in full extension, mid-flexion, and deep flexion. Proper tibial rotation was determined and marked.  The patella tracked well without a lateral release. Trial components were then removed and tibial preparation performed. A posterior capsular injection comprising of 20 cc of 1.3% exparel and 40 cc of normal saline was performed for postoperative pain control. The bony surfaces were irrigated with a pulse lavage and then dried. Bone cement was vacuum mixed on the back table, and the final components sized above were cemented into place. After cement had finished curing, excess cement was removed. The stability of the construct was re-evaluated throughout a range of motion and found to  be acceptable. The trial liner was removed, the knee was copiously irrigated, and  the knee was re-evaluated for any excess bone debris. The real polyethylene liner, 10 mm thick, was inserted and checked to ensure the locking mechanism had engaged appropriately. The tourniquet was deflated and hemostasis was achieved. The wound was irrigated with normal saline.  One gram of vancomycin powder was placed in the surgical bed. A drain was not placed. Capsular closure was performed with a #1 vicryl, subcutaneous fat closed with a 0 vicryl suture, then subcutaneous tissue closed with interrupted 2.0 vicryl suture. The skin was then closed with a 3.0 monocryl. A sterile dressing was applied.  The patient was awakened in the operating room and taken to recovery in stable condition. All sponge, needle, and instrument counts were correct at the end of the case.  Position: supine  Complications: none.  Time Out: performed   Drains/Packing: none  Estimated blood loss: minimal  Returned to Recovery Room: in good condition.   Antibiotics: yes   Mechanical VTE (DVT) Prophylaxis: sequential compression devices, TED thigh-high  Chemical VTE (DVT) Prophylaxis: aspirin  Fluid Replacement  Crystalloid: see anesthesia record Blood: none  FFP: none   Specimens Removed: 1 to pathology   Sponge and Instrument Count Correct? yes   PACU: portable radiograph - knee AP and Lateral   Plan/RTC: Return in 2 weeks for wound check.   Weight Bearing/Load Lower Extremity: full   N. Eduard Roux, MD [place] 4:19 PM

## 2018-03-18 NOTE — Anesthesia Postprocedure Evaluation (Signed)
Anesthesia Post Note  Patient: Kara Rodriguez  Procedure(s) Performed: RIGHT TOTAL KNEE ARTHROPLASTY (Right Knee)     Patient location during evaluation: PACU Anesthesia Type: Spinal Level of consciousness: oriented and awake and alert Pain management: pain level controlled Vital Signs Assessment: post-procedure vital signs reviewed and stable Respiratory status: spontaneous breathing, respiratory function stable and patient connected to nasal cannula oxygen Cardiovascular status: blood pressure returned to baseline and stable Postop Assessment: no headache, no backache and no apparent nausea or vomiting Anesthetic complications: no    Last Vitals:  Vitals:   03/18/18 1655 03/18/18 1709  BP: 105/69 101/66  Pulse: 70 (!) 56  Resp: 15 15  Temp:    SpO2: 92% 96%    Last Pain:  Vitals:   03/18/18 1713  TempSrc:   PainSc: 8                  Kaien Pezzullo DAVID

## 2018-03-18 NOTE — Anesthesia Procedure Notes (Signed)
Date/Time: 03/18/2018 2:13 PM Performed by: Trinna Post., CRNA Pre-anesthesia Checklist: Patient identified, Emergency Drugs available, Suction available, Patient being monitored and Timeout performed Patient Re-evaluated:Patient Re-evaluated prior to induction Oxygen Delivery Method: Simple face mask Preoxygenation: Pre-oxygenation with 100% oxygen Induction Type: IV induction Placement Confirmation: positive ETCO2

## 2018-03-18 NOTE — Anesthesia Procedure Notes (Signed)
Anesthesia Regional Block: Adductor canal block   Pre-Anesthetic Checklist: ,, timeout performed, Correct Patient, Correct Site, Correct Laterality, Correct Procedure, Correct Position, site marked, Risks and benefits discussed,  Surgical consent,  Pre-op evaluation,  At surgeon's request and post-op pain management  Laterality: Lower and Right  Prep: chloraprep       Needles:  Injection technique: Single-shot  Needle Type: Echogenic Stimulator Needle     Needle Length: 10cm  Needle Gauge: 21   Needle insertion depth: 3 cm   Additional Needles:   Procedures:,,,, ultrasound used (permanent image in chart),,,,  Narrative:  Start time: 03/18/2018 1:20 PM End time: 03/18/2018 1:26 PM Injection made incrementally with aspirations every 5 mL.  Performed by: Personally  Anesthesiologist: Lyn Hollingshead, MD

## 2018-03-18 NOTE — Progress Notes (Signed)
Orthopedic Tech Progress Note Patient Details:  Kara Rodriguez 02/20/1961 [id number]  CPM Right Knee CPM Right Knee: On Right Knee Flexion (Degrees): 90 Right Knee Extension (Degrees): 0 Additional Comments: Foot roll  Post Interventions Patient Tolerated: Well Instructions Provided: Care of device  [place] Pink 03/18/2018, 5:38 PM

## 2018-03-19 ENCOUNTER — Other Ambulatory Visit: Payer: Self-pay

## 2018-03-19 DIAGNOSIS — Z8249 Family history of ischemic heart disease and other diseases of the circulatory system: Secondary | ICD-10-CM | POA: Diagnosis not present

## 2018-03-19 DIAGNOSIS — M25761 Osteophyte, right knee: Secondary | ICD-10-CM | POA: Diagnosis present

## 2018-03-19 DIAGNOSIS — M170 Bilateral primary osteoarthritis of knee: Secondary | ICD-10-CM | POA: Diagnosis present

## 2018-03-19 DIAGNOSIS — Z825 Family history of asthma and other chronic lower respiratory diseases: Secondary | ICD-10-CM | POA: Diagnosis not present

## 2018-03-19 DIAGNOSIS — Z888 Allergy status to other drugs, medicaments and biological substances status: Secondary | ICD-10-CM | POA: Diagnosis not present

## 2018-03-19 DIAGNOSIS — Z8679 Personal history of other diseases of the circulatory system: Secondary | ICD-10-CM | POA: Diagnosis not present

## 2018-03-19 DIAGNOSIS — Z807 Family history of other malignant neoplasms of lymphoid, hematopoietic and related tissues: Secondary | ICD-10-CM | POA: Diagnosis not present

## 2018-03-19 LAB — CBC
HCT: 36.3 % (ref 36.0–46.0)
Hemoglobin: 12 g/dL (ref 12.0–15.0)
MCH: 30.7 pg (ref 26.0–34.0)
MCHC: 33.1 g/dL (ref 30.0–36.0)
MCV: 92.8 fL (ref 80.0–100.0)
NRBC: 0 % (ref 0.0–0.2)
Platelets: 168 10*3/uL (ref 150–400)
RBC: 3.91 MIL/uL (ref 3.87–5.11)
RDW: 13.2 % (ref 11.5–15.5)
WBC: 10 10*3/uL (ref 4.0–10.5)

## 2018-03-19 LAB — BASIC METABOLIC PANEL
Anion gap: 9 (ref 5–15)
BUN: 10 mg/dL (ref 6–20)
CO2: 26 mmol/L (ref 22–32)
Calcium: 8.8 mg/dL — ABNORMAL LOW (ref 8.9–10.3)
Chloride: 103 mmol/L (ref 98–111)
Creatinine, Ser: 0.79 mg/dL (ref 0.44–1.00)
GFR calc non Af Amer: >60 mL/min (ref >60)
Glucose, Bld: 177 mg/dL — ABNORMAL HIGH (ref 70–99)
POTASSIUM: 3.9 mmol/L (ref 3.5–5.1)
Sodium: 138 mmol/L (ref 135–145)

## 2018-03-19 MED ORDER — HYDROCODONE-ACETAMINOPHEN 7.5-325 MG PO TABS: 1.0000 | ORAL_TABLET | Freq: Four times a day (QID) | ORAL | 0 refills | Status: DC | PRN

## 2018-03-19 MED ORDER — HYDROCODONE-ACETAMINOPHEN 5-325 MG PO TABS: 1.0000 | ORAL_TABLET | ORAL | Status: DC | PRN

## 2018-03-19 NOTE — Progress Notes (Signed)
Subjective: 1 Day Post-Op Procedure(s) (LRB): RIGHT TOTAL KNEE ARTHROPLASTY (Right) Patient reports pain as mild.  Feeling great this am.  Objective: Vital signs in last 24 hours: Temp:  [97.4 F (36.3 C)-97.8 F (36.6 C)] 97.4 F (36.3 C) (01/07 0345) Pulse Rate:  [51-73] 73 (01/07 0345) Resp:  [11-20] 16 (01/06 2145) BP: (85-144)/(47-90) 104/67 (01/07 0345) SpO2:  [85 %-100 %] 93 % (01/07 0345)  Intake/Output from previous day: 01/06 0701 - 01/07 0700 In: 800 [I.V.:800] Out: 1200 [Urine:1100; Blood:100] Intake/Output this shift: No intake/output data recorded.  Recent Labs    03/19/18 0135  HGB 12.0   Recent Labs    03/19/18 0135  WBC 10.0  RBC 3.91  HCT 36.3  PLT 168   Recent Labs    03/19/18 0135  NA 138  K 3.9  CL 103  CO2 26  BUN 10  CREATININE 0.79  GLUCOSE 177*  CALCIUM 8.8*   No results for input(s): LABPT, INR in the last 72 hours.  Neurologically intact Neurovascular intact Sensation intact distally Intact pulses distally Dorsiflexion/Plantar flexion intact Incision: dressing C/D/I No cellulitis present Compartment soft  Anticipated LOS equal to or greater than 2 midnights due to - Age 58 and older with one or more of the following:  - Obesity  - Expected need for hospital services (PT, OT, Nursing) required for safe  discharge  - Anticipated need for postoperative skilled nursing care or inpatient rehab  - Active co-morbidities: None OR   - Unanticipated findings during/Post Surgery: Slow post-op progression: GI, pain control, mobility  - Patient is a high risk of re-admission due to: None   Assessment/Plan: 1 Day Post-Op Procedure(s) (LRB): RIGHT TOTAL KNEE ARTHROPLASTY (Right) Advance diet Up with therapy D/C IV fluids Discharge home with home health today after second PT session as long as she mobilizes well with PT WBAT RLE Bandage changed by me today Please apply ted hose to [place] 03/19/2018, 7:46  AM

## 2018-03-19 NOTE — Discharge Summary (Signed)
Patient ID: Kara Rodriguez MRN: [id number] DOB/AGE: [birth year] 58 y.o.  Admit date: 03/18/2018 Discharge date: 03/19/2018  Admission Diagnoses:  Active Problems:   Primary osteoarthritis of right knee   Total knee replacement status   Discharge Diagnoses:  Same  Past Medical History:  Diagnosis Date  . Bilateral primary osteoarthritis of knee 11/27/2017  . Complication of anesthesia    Pt sts she has to be careful when taking pain meds because it speeds up her heart rate.  . Primary localized osteoarthritis of knees, bilateral 05/25/2017  . WPW (Wolff-Parkinson-White syndrome)     Surgeries: Procedure(s): RIGHT TOTAL KNEE ARTHROPLASTY on 03/18/2018   Consultants:   Discharged Condition: Improved  Hospital Course: Darien Mignogna is an 58 y.o. female who was admitted 03/18/2018 for operative treatment of primary osteoarthritis right knee. Patient has severe unremitting pain that affects sleep, daily activities, and work/hobbies. After pre-op clearance the patient was taken to the operating room on 03/18/2018 and underwent  Procedure(s): RIGHT TOTAL KNEE ARTHROPLASTY.    Patient was given perioperative antibiotics:  Anti-infectives (From admission, onward)   Start     Dose/Rate Route Frequency Ordered Stop   03/18/18 2200  ceFAZolin (ANCEF) IVPB 2g/100 mL premix     2 g 200 mL/hr over 30 Minutes Intravenous Every 8 hours 03/18/18 2058 03/19/18 2159   03/18/18 1501  vancomycin (VANCOCIN) powder  Status:  Discontinued       As needed 03/18/18 1501 03/18/18 1635   03/18/18 1145  ceFAZolin (ANCEF) IVPB 2g/100 mL premix     2 g 200 mL/hr over 30 Minutes Intravenous On call to O.R. 03/18/18 1137 03/18/18 1458   03/18/18 0000  sulfamethoxazole-trimethoprim (BACTRIM DS,SEPTRA DS) 800-160 MG tablet     1 tablet Oral 2 times daily 03/18/18 1623         Patient was given sequential compression devices, early ambulation, and chemoprophylaxis to prevent DVT.  Patient benefited maximally  from hospital stay and there were no complications.    Recent vital signs:  Patient Vitals for the past 24 hrs:  BP Temp Temp src Pulse Resp SpO2  03/19/18 0345 104/67 (!) 97.4 F (36.3 C) Oral 73 - 93 %  03/18/18 2145 113/75 (!) 97.4 F (36.3 C) Oral (!) 57 16 96 %  03/18/18 2039 111/66 - - (!) 56 15 92 %  03/18/18 2009 114/73 - - (!) 54 11 (!) 86 %  03/18/18 1939 109/67 - - 62 16 95 %  03/18/18 1909 113/70 - - (!) 57 15 95 %  03/18/18 1854 115/63 - - 64 18 99 %  03/18/18 1839 110/70 - - (!) 56 11 99 %  03/18/18 1824 106/69 - - (!) 54 13 98 %  03/18/18 1810 - 97.7 F (36.5 C) - - - -  03/18/18 1809 101/65 - - (!) 51 12 98 %  03/18/18 1754 99/69 - - (!) 57 12 90 %  03/18/18 1740 (!) 101/47 - - (!) 53 14 94 %  03/18/18 1724 (!) 85/58 - - (!) 57 15 (!) 85 %  03/18/18 1709 101/66 - - (!) 56 15 96 %  03/18/18 1655 105/69 - - 70 15 92 %  03/18/18 1639 136/85 97.7 F (36.5 C) - (!) 51 - 100 %  03/18/18 1320 126/75 - - 63 13 95 %  03/18/18 1141 (!) 144/90 97.8 F (36.6 C) Oral 69 20 94 %     Recent laboratory studies:  Recent Labs    03/19/18 0135  WBC 10.0  HGB 12.0  HCT 36.3  PLT 168  NA 138  K 3.9  CL 103  CO2 26  BUN 10  CREATININE 0.79  GLUCOSE 177*  CALCIUM 8.8*     Discharge Medications:   Allergies as of 03/19/2018      Reactions   Flonase [fluticasone Propionate] Other (See Comments)   Makes her joints hurt    Miconazole Rash      Medication List    STOP taking these medications   acetaminophen 500 MG tablet Commonly known as:  TYLENOL     TAKE these medications   aspirin EC 81 MG tablet Take 1 tablet (81 mg total) by mouth 2 (two) times daily.   methocarbamol 750 MG tablet Commonly known as:  ROBAXIN Take 1 tablet (750 mg total) by mouth 2 (two) times daily as needed for muscle spasms.   ondansetron 4 MG tablet Commonly known as:  ZOFRAN Take 1-2 tablets (4-8 mg total) by mouth every 8 (eight) hours as needed for nausea or vomiting.    oxyCODONE 10 mg 12 hr tablet Commonly known as:  OXYCONTIN Take 1 tablet (10 mg total) by mouth every 12 (twelve) hours for 3 days.   oxyCODONE 5 MG immediate release tablet Commonly known as:  Oxy IR/ROXICODONE Take 1-3 tablets (5-15 mg total) by mouth every 4 (four) hours as needed.   promethazine 25 MG tablet Commonly known as:  PHENERGAN Take 1 tablet (25 mg total) by mouth every 6 (six) hours as needed for nausea.   senna-docusate 8.6-50 MG tablet Commonly known as:  SENOKOT S Take 1 tablet by mouth at bedtime as needed.   sulfamethoxazole-trimethoprim 800-160 MG tablet Commonly known as:  BACTRIM DS,SEPTRA DS Take 1 tablet by mouth 2 (two) times daily.            Durable Medical Equipment  (From admission, onward)         Start     Ordered   03/18/18 2059  DME Walker rolling  Once    Question:  Patient needs a walker to treat with the following condition  Answer:  Total knee replacement status   03/18/18 2058   03/18/18 2059  DME 3 n 1  Once     03/18/18 2058   03/18/18 2059  DME Bedside commode  Once    Question:  Patient needs a bedside commode to treat with the following condition  Answer:  Total knee replacement status   03/18/18 2058          Diagnostic Studies: Dg Chest 2 View  Result Date: 03/15/2018 CLINICAL DATA:  Preoperative evaluation for total knee replacement. EXAM: CHEST - 2 VIEW COMPARISON:  None. FINDINGS: Lungs are clear. Heart size and pulmonary vascularity are normal. No adenopathy. No pneumothorax. No bone lesions. IMPRESSION: No edema or consolidation. Electronically Signed   By: Lowella Grip III M.D.   On: 03/15/2018 11:41   Dg Knee Right Port  Result Date: 03/18/2018 CLINICAL DATA:  Postop right total knee replacement EXAM: PORTABLE RIGHT KNEE - 1-2 VIEW COMPARISON:  01/22/2018 FINDINGS: Post right total knee replacement. No evidence of hardware failure or loosening. Alignment appears anatomic. There is a small amount of expected  intra-articular air and adjacent subcutaneous emphysema. Dermal calcification again overlie the proximal aspect of the anterior lower leg. No radiopaque foreign body. IMPRESSION: Post right total knee replacement without evidence of complication. Electronically Signed   By: Sandi Mariscal M.D.   On: 03/18/2018  17:07    Disposition: Discharge disposition: 01-Home or Self Care         Follow-up Information    Leandrew Koyanagi, MD In 2 weeks.   Specialty:  Orthopedic Surgery Why:  For suture removal, For wound re-check Contact information: [place] [phone]            Signed: Aundra Dubin 03/19/2018, 7:49 AM

## 2018-03-19 NOTE — Progress Notes (Signed)
Orthopedic Tech Progress Note Patient Details:  Kara Rodriguez 04-29-1960 [id number]  CPM Right Knee CPM Right Knee: On Right Knee Flexion (Degrees): 90 Right Knee Extension (Degrees): 0 Additional Comments: Foot roll  Post Interventions Patient Tolerated: Well Instructions Provided: Care of device  [place] Pink 03/19/2018, 10:11 AM

## 2018-03-19 NOTE — Progress Notes (Addendum)
Physical Therapy Treatment Patient Details Name: Kara Rodriguez MRN: [id number] DOB: 1960-05-05 Today's Date: 03/19/2018    History of Present Illness Patient is a 58 y/o female s/p R TKA on 03/18/18. PMH significant for Wolff-Parkinson-White.    PT Comments    Pt progressing well towards goals. Gross min guard required throughout ambulation for safety, however, no LOB noted. Pt refusing to use DME during gait. Extensive review/practice of supine/seated HEP this session. Pt very eager to go home and regain independence and has practiced all necessary mobility tasks. Will continue to follow acutely to maximize functional mobility independence and safety.   Follow Up Recommendations  Follow surgeon's recommendation for DC plan and follow-up therapies     Equipment Recommendations  None recommended by PT(pt refusing DME )    Recommendations for Other Services       Precautions / Restrictions Precautions Precautions: Fall Restrictions Weight Bearing Restrictions: Yes RLE Weight Bearing: Weight bearing as tolerated    Mobility  Bed Mobility               General bed mobility comments: up in recliner   Transfers Overall transfer level: Needs assistance Equipment used: None Transfers: Sit to/from Stand Sit to Stand: Supervision         General transfer comment: Supervision for safety.   Ambulation/Gait Ambulation/Gait assistance: Min guard Gait Distance (Feet): 125 Feet Assistive device: None Gait Pattern/deviations: Step-through pattern;Decreased stride length;Decreased weight shift to right;Decreased stance time - right;Antalgic Gait velocity: decreased   General Gait Details: Pt refusing to use AD. Min guard for safety, and no overt LOB noted. Cues/demonstration for heel strike on the RLE.    Stairs             Wheelchair Mobility    Modified Rankin (Stroke Patients Only)       Balance Overall balance assessment: Mild deficits observed, not  formally tested                                          Cognition Arousal/Alertness: Awake/alert Behavior During Therapy: WFL for tasks assessed/performed Overall Cognitive Status: Within Functional Limits for tasks assessed                                        Exercises Total Joint Exercises Ankle Circles/Pumps: AROM;Both;20 reps;Seated Quad Sets: AROM;Right;10 reps;Seated Towel Squeeze: AROM;Both;5 reps;Seated Short Arc Quad: AROM;Right;5 reps;Seated Heel Slides: AROM;Right;10 reps;Seated(reclined in recliner ) Hip ABduction/ADduction: AROM;Right;5 reps;Seated Straight Leg Raises: AROM;Right;5 reps;Seated(reclined in recliner ) Long Arc Quad: AROM;Right;5 reps;Seated Knee Flexion: AROM;Right;5 reps;Seated    General Comments        Pertinent Vitals/Pain Pain Assessment: Faces Faces Pain Scale: Hurts little more Pain Location: R knee Pain Descriptors / Indicators: Discomfort;Sore Pain Intervention(s): Limited activity within patient's tolerance;Monitored during session;Repositioned    Home Living                      Prior Function            PT Goals (current goals can now be found in the care plan section) Acute Rehab PT Goals Patient Stated Goal: get back to riding rollercoasters PT Goal Formulation: With patient Time For Goal Achievement: 03/26/18 Potential to Achieve Goals: Good Progress towards PT goals: Progressing toward  goals    Frequency    7X/week      PT Plan Current plan remains appropriate    Co-evaluation              AM-PAC PT "6 Clicks" Mobility   Outcome Measure  Help needed turning from your back to your side while in a flat bed without using bedrails?: A Little Help needed moving from lying on your back to sitting on the side of a flat bed without using bedrails?: A Little Help needed moving to and from a bed to a chair (including a wheelchair)?: A Little Help needed standing up  from a chair using your arms (e.g., wheelchair or bedside chair)?: A Little Help needed to walk in hospital room?: A Little Help needed climbing 3-5 steps with a railing? : A Little 6 Click Score: 18    End of Session Equipment Utilized During Treatment: Gait belt Activity Tolerance: Patient tolerated treatment well Patient left: in chair;with call bell/phone within reach Nurse Communication: Mobility status PT Visit Diagnosis: Unsteadiness on feet (R26.81);Other abnormalities of gait and mobility (R26.89);Muscle weakness (generalized) (M62.81)     Time: 4128-7867 PT Time Calculation (min) (ACUTE ONLY): 24 min  Charges:  $Gait Training: 8-22 mins $Therapeutic Exercise: 8-22 mins                     Leighton Ruff, PT, DPT  Acute Rehabilitation Services  Pager: [phone] Office: [phone]    Rudean Hitt 03/19/2018, 3:43 PM

## 2018-03-19 NOTE — Plan of Care (Signed)
  Problem: Pain Managment: Goal: General experience of comfort will improve Outcome: Progressing   Problem: Safety: Goal: Ability to remain free from injury will improve Outcome: Progressing   Problem: Skin Integrity: Goal: Risk for impaired skin integrity will decrease Outcome: Progressing   

## 2018-03-19 NOTE — Care Management Note (Signed)
Case Management Note  Patient Details  Name: Kara Rodriguez MRN: [id number] Date of Birth: 06-09-1960  Subjective/Objective: 58 yr old female s/p right total knee arthroplasty.                   Action/Plan: Case manager spoke with patient concerning discharge plan and DME. Patient was preoperatively setup with [facility], no changes. She declines RW, says she ambulates "fine" without it and has not been using one. Patient says her toilets are high and she has ability to get up unassisted. Patient will have family support at discharge.    Expected Discharge Date:  03/19/18               Expected Discharge Plan:  [place]  In-House Referral:  NA  Discharge planning Services  CM Consult  Post Acute Care Choice:  Home Health Choice offered to:  Patient  DME Arranged:  (declines RW and 3in1) DME Agency:  NA  HH Arranged:  PT HH Agency:  [facility] (formerly [facility])  Status of Service:  Completed, signed off  If discussed at [employer], dates discussed:    Additional Comments:  Ninfa [facility], RN 03/19/2018, 10:54 AM

## 2018-03-19 NOTE — Evaluation (Signed)
Physical Therapy Evaluation Patient Details Name: Kara Rodriguez MRN: [id number] DOB: 1960-09-15 Today's Date: 03/19/2018   History of Present Illness  Patient is a 58 y/o female s/p R TKA on 03/18/18. PMH significant for Wolff-Parkinson-White.  Clinical Impression  Patient admitted with the above listed diagnosis. Patient reports Mod I with mobility and ADLs prior to admission. Patient today requiring general min guard/supervision level assist for transfers and mobility. Ambulating in hallway without AD currently - may need to address gait with AD if pain medication is removed? for general knee stability. Will continue to follow to continue to address stair training, gait training, and exercise for R LE.     Follow Up Recommendations Follow surgeon's recommendation for DC plan and follow-up therapies    Equipment Recommendations  None recommended by PT(will continue to assess)    Recommendations for Other Services       Precautions / Restrictions Precautions Precautions: Fall Restrictions Weight Bearing Restrictions: No      Mobility  Bed Mobility               General bed mobility comments: up in recliner   Transfers Overall transfer level: Needs assistance Equipment used: None Transfers: Sit to/from Stand Sit to Stand: Supervision         General transfer comment: fopr safety and immediate standing balance  Ambulation/Gait Ambulation/Gait assistance: Min guard;Supervision Gait Distance (Feet): 150 Feet Assistive device: None Gait Pattern/deviations: Step-through pattern;Decreased stride length;Decreased weight shift to right;Decreased stance time - right;Antalgic Gait velocity: decreased   General Gait Details: ambulating without assistive device; mild unsteadiness; very motivated  Stairs Stairs: Yes Stairs assistance: Min guard Stair Management: One rail Right;Step to pattern;Forwards Number of Stairs: 2    Wheelchair Mobility    Modified Rankin  (Stroke Patients Only)       Balance Overall balance assessment: Mild deficits observed, not formally tested                                           Pertinent Vitals/Pain Pain Assessment: 0-10 Pain Score: 2  Pain Location: R knee Pain Descriptors / Indicators: Discomfort;Sore Pain Intervention(s): Limited activity within patient's tolerance;Monitored during session    [place] expects to be discharged to:: Private residence Living Arrangements: Spouse/significant other Available Help at Discharge: Family;Available PRN/intermittently Type of Home: House Home Access: Stairs to enter Entrance Stairs-Rails: (through garage no rails; front door B rails) Entrance Stairs-Number of Steps: 3 Home Layout: Two level;Able to live on main level with bedroom/bathroom Home Equipment: None      Prior Function Level of Independence: Independent         Comments: retired; enjoys Tree surgeon        Extremity/Trunk Assessment   Upper Extremity Assessment Upper Extremity Assessment: Overall WFL for tasks assessed    Lower Extremity Assessment Lower Extremity Assessment: Generalized weakness;RLE deficits/detail RLE Deficits / Details: expected post-op pain and weakness RLE Sensation: WNL    Cervical / Trunk Assessment Cervical / Trunk Assessment: Normal  Communication   Communication: No difficulties  Cognition Arousal/Alertness: Awake/alert Behavior During Therapy: WFL for tasks assessed/performed Overall Cognitive Status: Within Functional Limits for tasks assessed  General Comments      Exercises     Assessment/Plan    PT Assessment Patient needs continued PT services  PT Problem List Decreased strength;Decreased range of motion;Decreased activity tolerance;Decreased balance;Decreased mobility;Decreased knowledge of use of DME;Decreased safety  awareness       PT Treatment Interventions DME instruction;Gait training;Stair training;Functional mobility training;Therapeutic activities;Therapeutic exercise;Balance training;Patient/family education    PT Goals (Current goals can be found in the Care Plan section)  Acute Rehab PT Goals Patient Stated Goal: get back to riding rollercoasters PT Goal Formulation: With patient Time For Goal Achievement: 03/26/18 Potential to Achieve Goals: Good    Frequency 7X/week   Barriers to discharge        Co-evaluation               AM-PAC PT "6 Clicks" Mobility  Outcome Measure Help needed turning from your back to your side while in a flat bed without using bedrails?: A Little Help needed moving from lying on your back to sitting on the side of a flat bed without using bedrails?: A Little Help needed moving to and from a bed to a chair (including a wheelchair)?: A Little Help needed standing up from a chair using your arms (e.g., wheelchair or bedside chair)?: A Little Help needed to walk in hospital room?: A Little Help needed climbing 3-5 steps with a railing? : A Little 6 Click Score: 18    End of Session Equipment Utilized During Treatment: Gait belt Activity Tolerance: Patient tolerated treatment well Patient left: in chair;with family/visitor present Nurse Communication: Mobility status PT Visit Diagnosis: Unsteadiness on feet (R26.81);Other abnormalities of gait and mobility (R26.89);Muscle weakness (generalized) (M62.81)    Time: 5537-4827 PT Time Calculation (min) (ACUTE ONLY): 24 min   Charges:   PT Evaluation $PT Eval Moderate Complexity: 1 Mod         Lanney Gins, PT, DPT Supplemental Physical Therapist 03/19/18 10:05 AM Pager: [phone] Office: [phone]

## 2018-03-20 ENCOUNTER — Telehealth (INDEPENDENT_AMBULATORY_CARE_PROVIDER_SITE_OTHER): Payer: Self-pay | Admitting: Orthopaedic Surgery

## 2018-03-20 ENCOUNTER — Encounter (HOSPITAL_COMMUNITY): Payer: Self-pay | Admitting: Orthopaedic Surgery

## 2018-03-20 DIAGNOSIS — Z471 Aftercare following joint replacement surgery: Secondary | ICD-10-CM | POA: Diagnosis not present

## 2018-03-20 DIAGNOSIS — M1712 Unilateral primary osteoarthritis, left knee: Secondary | ICD-10-CM | POA: Diagnosis not present

## 2018-03-20 DIAGNOSIS — I456 Pre-excitation syndrome: Secondary | ICD-10-CM | POA: Diagnosis not present

## 2018-03-20 NOTE — Telephone Encounter (Signed)
SEE MESSAGE BELOW:

## 2018-03-20 NOTE — Telephone Encounter (Signed)
Patient called advised she have the medication for both Hydrocodone and Oxycodone. Patient asked  If she can take the Hydrocodone during the day and the Oxycodone at night (1 tab) Patient asked what is a good time to take the medications? The number to contact patient is [phone]

## 2018-03-20 NOTE — Telephone Encounter (Signed)
Patient's husband Marcello Moores states patient woke up in severe pain and would  like to get a Rx for Oxycodone.  Please call Marcello Moores @ [phone]

## 2018-03-20 NOTE — Telephone Encounter (Signed)
Please advise 

## 2018-03-20 NOTE — Telephone Encounter (Signed)
It is probably the oxycontin (extended release) not oxycodone (immediate release) but double check.    Do not take oxycontin more than once per day if that is what she has Ok to alternate hydrocodone and oxycodone (if that is what it is), but do not take more than 1-2 pills of either every 4-6 hours

## 2018-03-20 NOTE — Telephone Encounter (Signed)
Ok  #30 

## 2018-03-21 ENCOUNTER — Telehealth (INDEPENDENT_AMBULATORY_CARE_PROVIDER_SITE_OTHER): Payer: Self-pay | Admitting: Orthopaedic Surgery

## 2018-03-21 ENCOUNTER — Other Ambulatory Visit (INDEPENDENT_AMBULATORY_CARE_PROVIDER_SITE_OTHER): Payer: Self-pay

## 2018-03-21 MED ORDER — OXYCODONE-ACETAMINOPHEN 5-325 MG PO TABS: 1.0000 | ORAL_TABLET | Freq: Two times a day (BID) | ORAL | 0 refills | Status: DC | PRN

## 2018-03-21 NOTE — Telephone Encounter (Signed)
Rx printed

## 2018-03-21 NOTE — Telephone Encounter (Signed)
Called husband said to reach her at her #. Called her no answer. LMOM to return our call.

## 2018-03-21 NOTE — Telephone Encounter (Signed)
Ok then she needs to start going to outpatient PT.  Let's refer her to PT and hand.  Thanks.

## 2018-03-21 NOTE — Telephone Encounter (Signed)
Called patient to advise on message no answer LMOM to return call.

## 2018-03-21 NOTE — Telephone Encounter (Signed)
See message.

## 2018-03-21 NOTE — Telephone Encounter (Signed)
Flor, with [facility] called to let you know she did the PT evaluation with the patient yesterday at which time the patient advised her that she feels that she does not need in home PT and has been doing all the exercises Dr. Erlinda Hong wanted her to do.  If you have any questions or concerns, Flor's CB#[phone].  Thank you.

## 2018-03-22 ENCOUNTER — Telehealth (INDEPENDENT_AMBULATORY_CARE_PROVIDER_SITE_OTHER): Payer: Self-pay | Admitting: Orthopaedic Surgery

## 2018-03-22 MED ORDER — GLYCERIN (ADULT) 2 G RE SUPP: 1.0000 | RECTAL | 0 refills | Status: DC | PRN

## 2018-03-22 MED ORDER — HYDROCODONE-ACETAMINOPHEN 7.5-325 MG PO TABS: 1.0000 | ORAL_TABLET | Freq: Three times a day (TID) | ORAL | 0 refills | Status: DC | PRN

## 2018-03-22 MED ORDER — MAGNESIUM CITRATE PO SOLN: 1.0000 | Freq: Once | ORAL | 3 refills | Status: AC

## 2018-03-22 NOTE — Telephone Encounter (Signed)
Please advise 

## 2018-03-22 NOTE — Telephone Encounter (Signed)
Called her and she states how will she get there. No one will be able to take her to Out patient PT. She still cannot drive. Has not had a BW since Monday. Is this normal? Taking stoll softeners.

## 2018-03-22 NOTE — Telephone Encounter (Signed)
ADVISED ON MSG

## 2018-03-22 NOTE — Telephone Encounter (Signed)
PATIENT PICKED UP

## 2018-03-22 NOTE — Telephone Encounter (Signed)
I sent it in but I thought I just gave her some percocet

## 2018-03-22 NOTE — Telephone Encounter (Signed)
She needs to try to cut back on the pain killers.  I will send in suppository and mag citrate.

## 2018-03-22 NOTE — Telephone Encounter (Signed)
Patient called left voicemail message needing Rx refilled (Hydrocodone) Patient said she have 11 tabs left and will run out over the weekend. Patient said she uses the [facility] on [address]. The number to contact patient is [phone]

## 2018-03-25 ENCOUNTER — Telehealth (INDEPENDENT_AMBULATORY_CARE_PROVIDER_SITE_OTHER): Payer: Self-pay | Admitting: Orthopaedic Surgery

## 2018-03-25 NOTE — Telephone Encounter (Signed)
Phmarmacy  [facility] [place]   Medication refill  Hydrocodone

## 2018-03-25 NOTE — Telephone Encounter (Signed)
I left message for return call.

## 2018-03-25 NOTE — Telephone Encounter (Signed)
Called pt. Duplicate message in chart.

## 2018-03-25 NOTE — Telephone Encounter (Signed)
Duplicate message in chart.  

## 2018-03-25 NOTE — Telephone Encounter (Signed)
FYI. Patient did have Oxycodone 5/325 1 po bid prn #30 prescribed on 03/21/2018.  She then had Hydrocodone 7.5/325 1-2 po tid prn #30 prescribed on 03/22/2018.

## 2018-03-25 NOTE — Telephone Encounter (Signed)
Was refilled on 03/22/2018. Patient was not aware.  Disregard message.

## 2018-03-25 NOTE — Telephone Encounter (Signed)
Can you check if she actually received the oxycodone prescription.  She might not have ever gotten it.

## 2018-03-25 NOTE — Telephone Encounter (Signed)
Multiple messages in chart regarding Rx and PT.  Patient got previous Oxycodone filled but never took it. She has been taking the Hydrocodone for pain.  I did advise to absolutely not mix the two.  She also states that she did not realize hydrocodone had been filled on 03/22/2018 so for me to discregard the request for refill today.  She made her a cocktail of Dulcolax and bowel movements are much better.  She is not able to attend outpatient PT but states that she is doing incredible on her own. She is not using a walker or cane and does her own home exercises. She does not know if you want HHPT to continue coming out since she does not have a ride to outpatient.

## 2018-03-25 NOTE — Telephone Encounter (Signed)
Patient left a message stating she was returning your call.  CB#[phone].  Thank you.

## 2018-04-02 ENCOUNTER — Ambulatory Visit (INDEPENDENT_AMBULATORY_CARE_PROVIDER_SITE_OTHER): Payer: 59 | Admitting: Physician Assistant

## 2018-04-02 ENCOUNTER — Encounter (INDEPENDENT_AMBULATORY_CARE_PROVIDER_SITE_OTHER): Payer: Self-pay

## 2018-04-02 ENCOUNTER — Encounter (INDEPENDENT_AMBULATORY_CARE_PROVIDER_SITE_OTHER): Payer: Self-pay | Admitting: Orthopaedic Surgery

## 2018-04-02 DIAGNOSIS — Z96651 Presence of right artificial knee joint: Secondary | ICD-10-CM

## 2018-04-02 MED ORDER — HYDROCODONE-ACETAMINOPHEN 5-325 MG PO TABS: 1.0000 | ORAL_TABLET | Freq: Four times a day (QID) | ORAL | 0 refills | Status: DC | PRN

## 2018-04-02 NOTE — Progress Notes (Signed)
Post-Op Visit Note   Patient: Kara Rodriguez           Date of Birth: 03/27/1960           MRN: [id number] Visit Date: 04/02/2018 PCP: Donald Prose, MD   Assessment & Plan:  Chief Complaint:  Chief Complaint  Patient presents with  . Right Knee - Routine Post Op   Visit Diagnoses:  1. S/P TKR (total knee replacement), right     Plan: Patient is a pleasant 58 year old female who presents our clinic today 2 weeks status post right total knee replacement, date of surgery 03/18/2018.  He had 1 home health physical therapy session and has been doing the remainder of exercises on her own.  She has been doing well with this.  She has been taking narcotic pain medication with great relief of symptoms.  No fevers or chills.  Overall feeling great.  Examination of her right knee reveals a well-healed surgical incision without evidence of infection.  Calf is soft and nontender.  At this point, we will transition her to outpatient physical therapy.  A prescription was provided today to the patient.  I have refilled her Norco.  She is asked for refill of OxyContin which I have politely declined.  She will follow-up with [place] in 4 weeks time for repeat evaluation and 3 view x-rays of the right knee.  Follow-Up Instructions: Return in about 4 weeks (around 04/30/2018).   Orders:  No orders of the defined types were placed in this encounter.  Meds ordered this encounter  Medications  . HYDROcodone-acetaminophen (NORCO) 5-325 MG tablet    Sig: Take 1-2 tablets by mouth every 6 (six) hours as needed for moderate pain.    Dispense:  30 tablet    Refill:  0    Imaging: No new imaging  PMFS History: Patient Active Problem List   Diagnosis Date Noted  . S/P TKR (total knee replacement), right 03/18/2018  . Bilateral primary osteoarthritis of knee 11/27/2017  . Primary osteoarthritis of right knee 05/25/2017   Past Medical History:  Diagnosis Date  . Bilateral primary osteoarthritis of knee  11/27/2017  . Complication of anesthesia    Pt sts she has to be careful when taking pain meds because it speeds up her heart rate.  . Primary localized osteoarthritis of knees, bilateral 05/25/2017  . WPW (Wolff-Parkinson-White syndrome)     Family History  Problem Relation Age of Onset  . Heart attack Mother 50  . Non-Hodgkin's lymphoma Father   . Healthy Brother   . Other Maternal Grandmother        RAN AWAY  . Other Maternal Grandfather        RUN OVER BY STEAM ROLLER  . Other Paternal Grandmother        FAILURE TO THRIVE  . Emphysema Paternal Grandfather   . Other Other        MOTHER 'S PATERNAL UNCLE FROZEN TO DEATH  . Cancer Son 58       TESTICULAR  . Healthy Daughter   . Healthy Daughter     Past Surgical History:  Procedure Laterality Date  . COLONOSCOPY    . DILATION AND CURETTAGE OF UTERUS    . TOTAL KNEE ARTHROPLASTY Right 03/18/2018   Procedure: RIGHT TOTAL KNEE ARTHROPLASTY;  Surgeon: Leandrew Koyanagi, MD;  Location: [facility];  Service: Orthopedics;  Laterality: Right;   Social History   Occupational History  . Not on file  Tobacco Use  .  Smoking status: Never Smoker  . Smokeless tobacco: Never Used  Substance and Sexual Activity  . Alcohol use: Yes    Alcohol/week: 2.0 standard drinks    Types: 2 Glasses of wine per week    Frequency: Never  . Drug use: Never  . Sexual activity: Not on file    Comment: MARRIED

## 2018-04-19 DIAGNOSIS — Z1231 Encounter for screening mammogram for malignant neoplasm of breast: Secondary | ICD-10-CM | POA: Diagnosis not present

## 2018-04-26 DIAGNOSIS — M25561 Pain in right knee: Secondary | ICD-10-CM | POA: Diagnosis not present

## 2018-04-26 DIAGNOSIS — M25661 Stiffness of right knee, not elsewhere classified: Secondary | ICD-10-CM | POA: Diagnosis not present

## 2018-04-26 DIAGNOSIS — R531 Weakness: Secondary | ICD-10-CM | POA: Diagnosis not present

## 2018-04-29 DIAGNOSIS — M25661 Stiffness of right knee, not elsewhere classified: Secondary | ICD-10-CM | POA: Diagnosis not present

## 2018-04-29 DIAGNOSIS — R531 Weakness: Secondary | ICD-10-CM | POA: Diagnosis not present

## 2018-04-29 DIAGNOSIS — M25561 Pain in right knee: Secondary | ICD-10-CM | POA: Diagnosis not present

## 2018-04-30 ENCOUNTER — Ambulatory Visit (INDEPENDENT_AMBULATORY_CARE_PROVIDER_SITE_OTHER): Payer: 59

## 2018-04-30 ENCOUNTER — Ambulatory Visit (INDEPENDENT_AMBULATORY_CARE_PROVIDER_SITE_OTHER): Payer: 59 | Admitting: Orthopaedic Surgery

## 2018-04-30 DIAGNOSIS — Z96651 Presence of right artificial knee joint: Secondary | ICD-10-CM | POA: Diagnosis not present

## 2018-04-30 NOTE — Progress Notes (Signed)
Post-Op Visit Note   Patient: Paislea Hatton           Date of Birth: 1960/07/18           MRN: [id number] Visit Date: 04/30/2018 PCP: Donald Prose, MD   Assessment & Plan:  Chief Complaint:  Chief Complaint  Patient presents with  . Right Knee - Follow-up   Visit Diagnoses:  1. Status post right knee replacement     Plan: 6 week TKA follow up plan  Patient presents for follow up 6 weeks status post right total knee replacement. The wound is healed and there is no evidence of infection. TED hose may be discontinued. She can flex to 108 degrees.  Radiographs reveal a total knee arthroplasty in good position, with no evidence of subsidence, loosening, or complicating features. Reminders were given about signs to be aware of including redness, drainage, increased pain, fevers, calf pain, shortness of breath, or any concern should generate a phone call or a return to see [place] immediately. She will call us for antibiotics prior to any dental procedures or colonoscopies.  Will plan to follow up at 3 months postoperatively for next evaluation.  Follow-Up Instructions: Return in about 6 weeks (around 06/11/2018).   Orders:  Orders Placed This Encounter  Procedures  . XR KNEE 3 VIEW RIGHT   No orders of the defined types were placed in this encounter.   Imaging: Xr Knee 3 View Right  Result Date: 04/30/2018 Stable right total knee replacement   PMFS History: Patient Active Problem List   Diagnosis Date Noted  . S/P TKR (total knee replacement), right 03/18/2018  . Bilateral primary osteoarthritis of knee 11/27/2017  . Primary osteoarthritis of right knee 05/25/2017   Past Medical History:  Diagnosis Date  . Bilateral primary osteoarthritis of knee 11/27/2017  . Complication of anesthesia    Pt sts she has to be careful when taking pain meds because it speeds up her heart rate.  . Primary localized osteoarthritis of knees, bilateral 05/25/2017  . WPW (Wolff-Parkinson-White  syndrome)     Family History  Problem Relation Age of Onset  . Heart attack Mother 77  . Non-Hodgkin's lymphoma Father   . Healthy Brother   . Other Maternal Grandmother        RAN AWAY  . Other Maternal Grandfather        RUN OVER BY STEAM ROLLER  . Other Paternal Grandmother        FAILURE TO THRIVE  . Emphysema Paternal Grandfather   . Other Other        MOTHER 'S PATERNAL UNCLE FROZEN TO DEATH  . Cancer Son 60       TESTICULAR  . Healthy Daughter   . Healthy Daughter     Past Surgical History:  Procedure Laterality Date  . COLONOSCOPY    . DILATION AND CURETTAGE OF UTERUS    . TOTAL KNEE ARTHROPLASTY Right 03/18/2018   Procedure: RIGHT TOTAL KNEE ARTHROPLASTY;  Surgeon: Leandrew Koyanagi, MD;  Location: [facility];  Service: Orthopedics;  Laterality: Right;   Social History   Occupational History  . Not on file  Tobacco Use  . Smoking status: Never Smoker  . Smokeless tobacco: Never Used  Substance and Sexual Activity  . Alcohol use: Yes    Alcohol/week: 2.0 standard drinks    Types: 2 Glasses of wine per week    Frequency: Never  . Drug use: Never  . Sexual activity: Not on file  Comment: MARRIED

## 2018-05-01 DIAGNOSIS — M25561 Pain in right knee: Secondary | ICD-10-CM | POA: Diagnosis not present

## 2018-05-01 DIAGNOSIS — M25661 Stiffness of right knee, not elsewhere classified: Secondary | ICD-10-CM | POA: Diagnosis not present

## 2018-05-01 DIAGNOSIS — R531 Weakness: Secondary | ICD-10-CM | POA: Diagnosis not present

## 2018-05-07 DIAGNOSIS — R531 Weakness: Secondary | ICD-10-CM | POA: Diagnosis not present

## 2018-05-07 DIAGNOSIS — M25661 Stiffness of right knee, not elsewhere classified: Secondary | ICD-10-CM | POA: Diagnosis not present

## 2018-05-07 DIAGNOSIS — M25561 Pain in right knee: Secondary | ICD-10-CM | POA: Diagnosis not present

## 2018-05-09 DIAGNOSIS — R531 Weakness: Secondary | ICD-10-CM | POA: Diagnosis not present

## 2018-05-09 DIAGNOSIS — M25661 Stiffness of right knee, not elsewhere classified: Secondary | ICD-10-CM | POA: Diagnosis not present

## 2018-05-09 DIAGNOSIS — M25561 Pain in right knee: Secondary | ICD-10-CM | POA: Diagnosis not present

## 2018-05-14 DIAGNOSIS — R531 Weakness: Secondary | ICD-10-CM | POA: Diagnosis not present

## 2018-05-14 DIAGNOSIS — M25661 Stiffness of right knee, not elsewhere classified: Secondary | ICD-10-CM | POA: Diagnosis not present

## 2018-05-14 DIAGNOSIS — M25561 Pain in right knee: Secondary | ICD-10-CM | POA: Diagnosis not present

## 2018-05-16 DIAGNOSIS — R531 Weakness: Secondary | ICD-10-CM | POA: Diagnosis not present

## 2018-05-16 DIAGNOSIS — M25661 Stiffness of right knee, not elsewhere classified: Secondary | ICD-10-CM | POA: Diagnosis not present

## 2018-05-16 DIAGNOSIS — M25561 Pain in right knee: Secondary | ICD-10-CM | POA: Diagnosis not present

## 2018-05-20 ENCOUNTER — Telehealth (INDEPENDENT_AMBULATORY_CARE_PROVIDER_SITE_OTHER): Payer: Self-pay | Admitting: Orthopaedic Surgery

## 2018-05-20 ENCOUNTER — Other Ambulatory Visit (INDEPENDENT_AMBULATORY_CARE_PROVIDER_SITE_OTHER): Payer: Self-pay | Admitting: Radiology

## 2018-05-20 MED ORDER — AMOXICILLIN 500 MG PO TABS: ORAL_TABLET | ORAL | 0 refills | Status: DC

## 2018-05-20 NOTE — Telephone Encounter (Signed)
I sent in Amoxicillin 2g to patients pharm, per our standing orders.

## 2018-05-20 NOTE — Telephone Encounter (Signed)
pls advise. Thanks.  

## 2018-05-20 NOTE — Telephone Encounter (Signed)
Patient left a voicemail message requesting an antibiotic to be called in.  Patient has a dental appointment tomorrow March 10th.  CB#[phone].  Thank you.

## 2018-05-21 DIAGNOSIS — M25661 Stiffness of right knee, not elsewhere classified: Secondary | ICD-10-CM | POA: Diagnosis not present

## 2018-05-21 DIAGNOSIS — R531 Weakness: Secondary | ICD-10-CM | POA: Diagnosis not present

## 2018-05-21 DIAGNOSIS — M25561 Pain in right knee: Secondary | ICD-10-CM | POA: Diagnosis not present

## 2018-05-21 NOTE — Telephone Encounter (Signed)
thanks

## 2018-05-24 DIAGNOSIS — R531 Weakness: Secondary | ICD-10-CM | POA: Diagnosis not present

## 2018-05-24 DIAGNOSIS — M25561 Pain in right knee: Secondary | ICD-10-CM | POA: Diagnosis not present

## 2018-05-24 DIAGNOSIS — M25661 Stiffness of right knee, not elsewhere classified: Secondary | ICD-10-CM | POA: Diagnosis not present

## 2018-06-11 ENCOUNTER — Ambulatory Visit (INDEPENDENT_AMBULATORY_CARE_PROVIDER_SITE_OTHER): Payer: 59 | Admitting: Orthopaedic Surgery

## 2018-11-26 ENCOUNTER — Telehealth: Payer: Self-pay | Admitting: Orthopaedic Surgery

## 2018-11-26 MED ORDER — AMOXICILLIN 500 MG PO TABS: ORAL_TABLET | ORAL | 0 refills | Status: DC

## 2018-11-26 NOTE — Telephone Encounter (Signed)
Pt called in said she has a dental appt tomorrow and needs her pre dental antibiotic, her surgery was on   .  Please send Amoxicillin 2g (500mg  #4) to [facility] on [address]   [phone]

## 2018-11-26 NOTE — Telephone Encounter (Signed)
Sent to pharmacy. Patient aware. She also needed to reschedule appt that she cancelled for 6 week follow up post total knee due to [place]. Appt made.

## 2018-11-29 ENCOUNTER — Other Ambulatory Visit: Payer: Self-pay

## 2018-11-29 ENCOUNTER — Ambulatory Visit (INDEPENDENT_AMBULATORY_CARE_PROVIDER_SITE_OTHER): Payer: Managed Care, Other (non HMO)

## 2018-11-29 ENCOUNTER — Ambulatory Visit (INDEPENDENT_AMBULATORY_CARE_PROVIDER_SITE_OTHER): Payer: Managed Care, Other (non HMO) | Admitting: Orthopaedic Surgery

## 2018-11-29 DIAGNOSIS — Z96651 Presence of right artificial knee joint: Secondary | ICD-10-CM

## 2018-11-29 NOTE — Progress Notes (Signed)
Office Visit Note   Patient: Kara Rodriguez           Date of Birth: [birth year]           MRN: [id number] Visit Date: 11/29/2018              Requested by: Donald Prose, MD [address] [place],  [place] [address] PCP: Donald Prose, MD   Assessment & Plan: Visit Diagnoses:  1. History of total right knee replacement     Plan: Impression is 8 months status post right total knee replacement.  Should she has done very well from the surgery and overall I am very pleased with her progress.  She will continue try to get the last few degrees of flexion.  We will see her back in another year with 2 view x-rays of the right knee.  Follow-Up Instructions: Return in about 1 year (around 11/29/2019).   Orders:  Orders Placed This Encounter  Procedures  . XR KNEE 3 VIEW RIGHT   No orders of the defined types were placed in this encounter.     Procedures: No procedures performed   Clinical Data: No additional findings.   Subjective: Chief Complaint  Patient presents with  . Right Knee - Follow-up    Patient is 8 months status post right total knee replacement.  Overall she is doing well.  She was dancing last weekend.  She has no complaints other than some occasional soreness.  She is very happy overall.   Review of Systems   Objective: Vital Signs: There were no vitals taken for this visit.  Physical Exam  Ortho Exam Right knee exam shows a fully healed surgical scar.  Range of motion 0-115 degrees. Specialty Comments:  No specialty comments available.  Imaging: Xr Knee 3 View Right  Result Date: 11/29/2018 Stable total knee replacement without complication.    PMFS History: Patient Active Problem List   Diagnosis Date Noted  . S/P TKR (total knee replacement), right 03/18/2018  . Bilateral primary osteoarthritis of knee 11/27/2017  . Primary osteoarthritis of right knee 05/25/2017   Past Medical History:  Diagnosis Date  . Bilateral primary  osteoarthritis of knee 11/27/2017  . Complication of anesthesia    Pt sts she has to be careful when taking pain meds because it speeds up her heart rate.  . Primary localized osteoarthritis of knees, bilateral 05/25/2017  . WPW (Wolff-Parkinson-White syndrome)     Family History  Problem Relation Age of Onset  . Heart attack Mother 35  . Non-Hodgkin's lymphoma Father   . Healthy Brother   . Other Maternal Grandmother        RAN AWAY  . Other Maternal Grandfather        RUN OVER BY STEAM ROLLER  . Other Paternal Grandmother        FAILURE TO THRIVE  . Emphysema Paternal Grandfather   . Other Other        MOTHER 'S PATERNAL UNCLE FROZEN TO DEATH  . Cancer Son 20       TESTICULAR  . Healthy Daughter   . Healthy Daughter     Past Surgical History:  Procedure Laterality Date  . COLONOSCOPY    . DILATION AND CURETTAGE OF UTERUS    . TOTAL KNEE ARTHROPLASTY Right 03/18/2018   Procedure: RIGHT TOTAL KNEE ARTHROPLASTY;  Surgeon: Leandrew Koyanagi, MD;  Location: [facility];  Service: Orthopedics;  Laterality: Right;   Social History  Occupational History  . Not on file  Tobacco Use  . Smoking status: Never Smoker  . Smokeless tobacco: Never Used  Substance and Sexual Activity  . Alcohol use: Yes    Alcohol/week: 2.0 standard drinks    Types: 2 Glasses of wine per week    Frequency: Never  . Drug use: Never  . Sexual activity: Not on file    Comment: MARRIED

## 2019-12-02 ENCOUNTER — Ambulatory Visit (INDEPENDENT_AMBULATORY_CARE_PROVIDER_SITE_OTHER): Payer: Managed Care, Other (non HMO) | Admitting: Orthopaedic Surgery

## 2019-12-02 ENCOUNTER — Ambulatory Visit: Payer: Self-pay

## 2019-12-02 ENCOUNTER — Encounter: Payer: Self-pay | Admitting: Orthopaedic Surgery

## 2019-12-02 DIAGNOSIS — M1712 Unilateral primary osteoarthritis, left knee: Secondary | ICD-10-CM

## 2019-12-02 DIAGNOSIS — Z96651 Presence of right artificial knee joint: Secondary | ICD-10-CM | POA: Diagnosis not present

## 2019-12-02 NOTE — Progress Notes (Signed)
Office Visit Note   Patient: Kara Rodriguez           Date of Birth: 11/09/1960           MRN: [id number] Visit Date: 12/02/2019              Requested by: Donald Prose, MD [address],  [place] [address] PCP: Donald Prose, MD   Assessment & Plan: Visit Diagnoses:  1. History of total right knee replacement   2. Primary osteoarthritis of left knee     Plan: Impression is status post right total knee replacement doing well and left knee osteoarthritis.  We will submit for free Visco injection for the left knee.  Her right knee is doing well.  Follow-up in a year with two-view x-rays of the right knee.  Follow-Up Instructions: Return in about 1 year (around 12/01/2020).   Orders:  Orders Placed This Encounter  Procedures  . XR Knee 1-2 Views Right   No orders of the defined types were placed in this encounter.     Procedures: No procedures performed   Clinical Data: No additional findings.   Subjective: Chief Complaint  Patient presents with  . Right Knee - Pain    Patient is 20 months status post right total knee replacement.  Doing well has no complaints.   Review of Systems   Objective: Vital Signs: There were no vitals taken for this visit.  Physical Exam  Ortho Exam Right knee shows a fully healed surgical scar with excellent range of motion.  No swelling. Left knee exam shows no joint effusion.  Mild patellofemoral crepitus with range of motion. Specialty Comments:  No specialty comments available.  Imaging: XR Knee 1-2 Views Right  Result Date: 12/02/2019 Stable total knee replacement in good alignment     PMFS History: Patient Active Problem List   Diagnosis Date Noted  . S/P TKR (total knee replacement), right 03/18/2018  . Bilateral primary osteoarthritis of knee 11/27/2017  . Primary osteoarthritis of right knee 05/25/2017   Past Medical History:  Diagnosis Date  . Bilateral primary osteoarthritis of knee  11/27/2017  . Complication of anesthesia    Pt sts she has to be careful when taking pain meds because it speeds up her heart rate.  . Primary localized osteoarthritis of knees, bilateral 05/25/2017  . WPW (Wolff-Parkinson-White syndrome)     Family History  Problem Relation Age of Onset  . Heart attack Mother 17  . Non-Hodgkin's lymphoma Father   . Healthy Brother   . Other Maternal Grandmother        RAN AWAY  . Other Maternal Grandfather        RUN OVER BY STEAM ROLLER  . Other Paternal Grandmother        FAILURE TO THRIVE  . Emphysema Paternal Grandfather   . Other Other        MOTHER 'S PATERNAL UNCLE FROZEN TO DEATH  . Cancer Son 39       TESTICULAR  . Healthy Daughter   . Healthy Daughter     Past Surgical History:  Procedure Laterality Date  . COLONOSCOPY    . DILATION AND CURETTAGE OF UTERUS    . TOTAL KNEE ARTHROPLASTY Right 03/18/2018   Procedure: RIGHT TOTAL KNEE ARTHROPLASTY;  Surgeon: Leandrew Koyanagi, MD;  Location: [facility];  Service: Orthopedics;  Laterality: Right;   Social History   Occupational History  . Not on file  Tobacco Use  .  Smoking status: Never Smoker  . Smokeless tobacco: Never Used  Vaping Use  . Vaping Use: Never used  Substance and Sexual Activity  . Alcohol use: Yes    Alcohol/week: 2.0 standard drinks    Types: 2 Glasses of wine per week  . Drug use: Never  . Sexual activity: Not on file    Comment: MARRIED

## 2019-12-21 ENCOUNTER — Other Ambulatory Visit (HOSPITAL_COMMUNITY): Payer: Self-pay | Admitting: Oncology

## 2019-12-21 DIAGNOSIS — U071 COVID-19: Secondary | ICD-10-CM

## 2019-12-21 NOTE — Progress Notes (Signed)
I connected by phone with Mrs. Barge to discuss the potential use of an new treatment for mild to moderate COVID-19 viral infection in non-hospitalized patients.   This patient is a age/sex that meets the FDA criteria for Emergency Use Authorization of casirivimab\imdevimab.  Has a (+) direct SARS-CoV-2 viral test result 1. Has mild or moderate COVID-19  2. Is ? 59 years of age and weighs ? 40 kg 3. Is NOT hospitalized due to COVID-19 4. Is NOT requiring oxygen therapy or requiring an increase in baseline oxygen flow rate due to COVID-19 5. Is within 10 days of symptom onset 6. Has at least one of the high risk factor(s) for progression to severe COVID-19 and/or hospitalization as defined in EUA. Specific high risk criteria : Past Medical History:  Diagnosis Date  . Bilateral primary osteoarthritis of knee 11/27/2017  . Complication of anesthesia    Pt sts she has to be careful when taking pain meds because it speeds up her heart rate.  . Primary localized osteoarthritis of knees, bilateral 05/25/2017  . WPW (Wolff-Parkinson-White syndrome)   ?  ?    Symptom onset  12/17/2019   I have spoken and communicated the following to the patient or parent/caregiver:   1. FDA has authorized the emergency use of casirivimab\imdevimab for the treatment of mild to moderate COVID-19 in adults and pediatric patients with positive results of direct SARS-CoV-2 viral testing who are 74 years of age and older weighing at least 40 kg, and who are at high risk for progressing to severe COVID-19 and/or hospitalization.   2. The significant known and potential risks and benefits of casirivimab\imdevimab, and the extent to which such potential risks and benefits are unknown.   3. Information on available alternative treatments and the risks and benefits of those alternatives, including clinical trials.   4. Patients treated with casirivimab\imdevimab should continue to self-isolate and use infection control  measures (e.g., wear mask, isolate, social distance, avoid sharing personal items, clean and disinfect "high touch" surfaces, and frequent handwashing) according to CDC guidelines.    5. The patient or parent/caregiver has the option to accept or refuse casirivimab\imdevimab .   After reviewing this information with the patient, The patient agreed to proceed with receiving casirivimab\imdevimab infusion and will be provided a copy of the Fact sheet prior to receiving the infusion.Rulon Abide, AGNP-C [phone] ([facility])

## 2019-12-22 ENCOUNTER — Ambulatory Visit (HOSPITAL_COMMUNITY)
Admission: RE | Admit: 2019-12-22 | Discharge: 2019-12-22 | Disposition: A | Discharge status: Home / Self Care | Payer: 59 | Source: Ambulatory Visit | Attending: Pulmonary Disease | Admitting: Pulmonary Disease

## 2019-12-22 DIAGNOSIS — U071 COVID-19: Secondary | ICD-10-CM | POA: Diagnosis present

## 2019-12-22 MED ORDER — ALBUTEROL SULFATE HFA 108 (90 BASE) MCG/ACT IN AERS: 2.0000 | INHALATION_SPRAY | Freq: Once | RESPIRATORY_TRACT | Status: DC | PRN

## 2019-12-22 MED ORDER — SODIUM CHLORIDE 0.9 % IV SOLN: INTRAVENOUS | Status: DC | PRN

## 2019-12-22 MED ORDER — FAMOTIDINE IN NACL 20-0.9 MG/50ML-% IV SOLN: 20.0000 mg | Freq: Once | INTRAVENOUS | Status: DC | PRN

## 2019-12-22 MED ORDER — SODIUM CHLORIDE 0.9 % IV SOLN: Freq: Once | INTRAVENOUS | Status: AC

## 2019-12-22 MED ORDER — DIPHENHYDRAMINE HCL 50 MG/ML IJ SOLN: 50.0000 mg | Freq: Once | INTRAMUSCULAR | Status: DC | PRN

## 2019-12-22 MED ORDER — METHYLPREDNISOLONE SODIUM SUCC 125 MG IJ SOLR: 125.0000 mg | Freq: Once | INTRAMUSCULAR | Status: DC | PRN

## 2019-12-22 MED ORDER — EPINEPHRINE 0.3 MG/0.3ML IJ SOAJ: 0.3000 mg | Freq: Once | INTRAMUSCULAR | Status: DC | PRN

## 2019-12-22 NOTE — Discharge Instructions (Signed)

## 2019-12-22 NOTE — Progress Notes (Signed)
  Diagnosis: COVID-19  Physician: DR. Joya Gaskins  Procedure: [facility] Med: bamlanivimab\etesevimab infusion - Provided patient with bamlanimivab\etesevimab fact sheet for patients, parents and caregivers prior to infusion.  Complications: No immediate complications noted.  Discharge: Discharged home   Ludwig Lean 12/22/2019

## 2019-12-23 ENCOUNTER — Other Ambulatory Visit: Payer: Self-pay

## 2019-12-23 ENCOUNTER — Other Ambulatory Visit: Payer: Managed Care, Other (non HMO)

## 2019-12-23 DIAGNOSIS — Z20822 Contact with and (suspected) exposure to covid-19: Secondary | ICD-10-CM

## 2019-12-24 LAB — NOVEL CORONAVIRUS, NAA: SARS-CoV-2, NAA: DETECTED — AB

## 2019-12-24 LAB — SARS-COV-2, NAA 2 DAY TAT

## 2020-02-19 IMAGING — CR DG CHEST 2V
2 series · 2 of 2 positions shown · non-contrast
Comparison: None.

CLINICAL DATA: Preoperative evaluation for total knee replacement.

EXAM:
CHEST - 2 VIEW

[w chest pa]
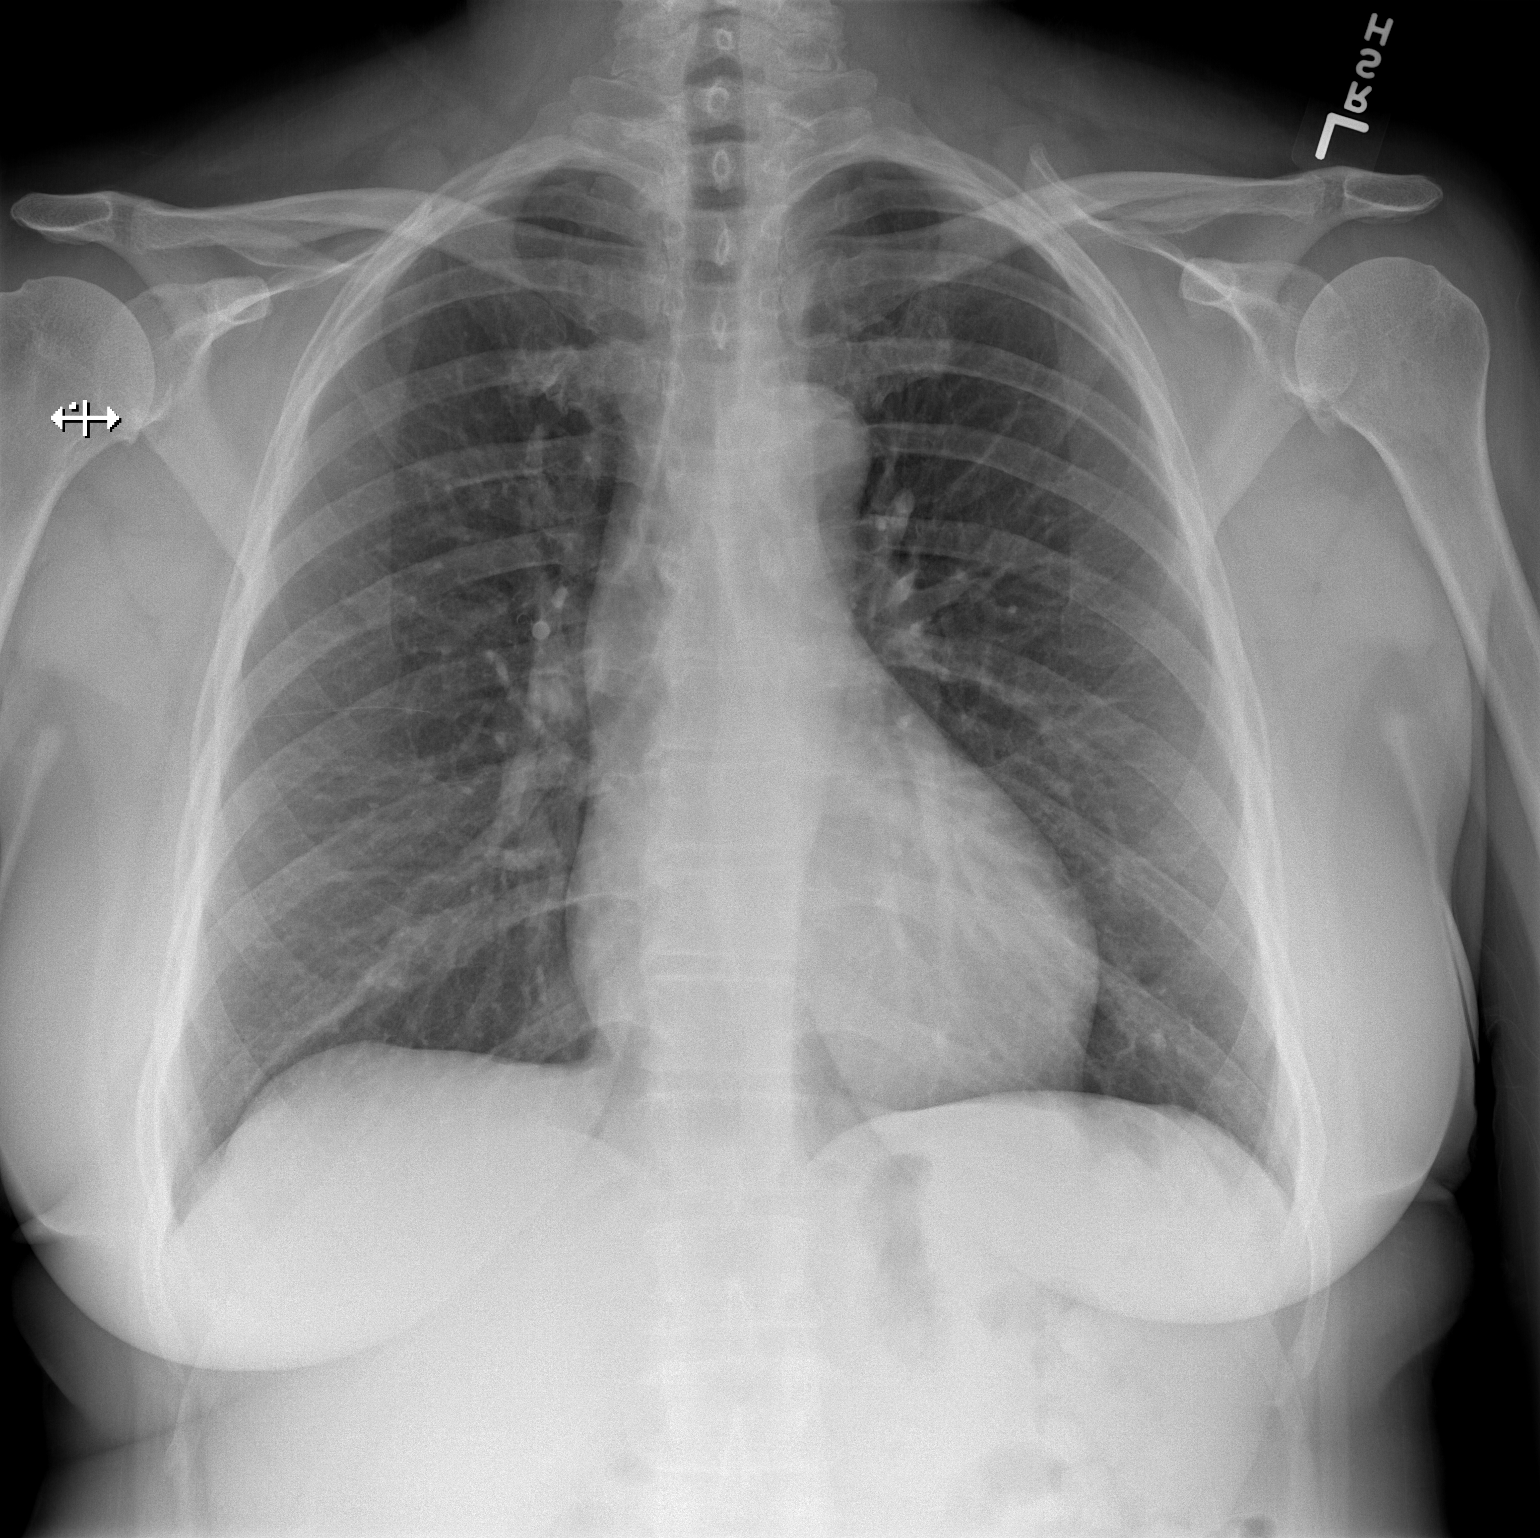

[w chest lat]
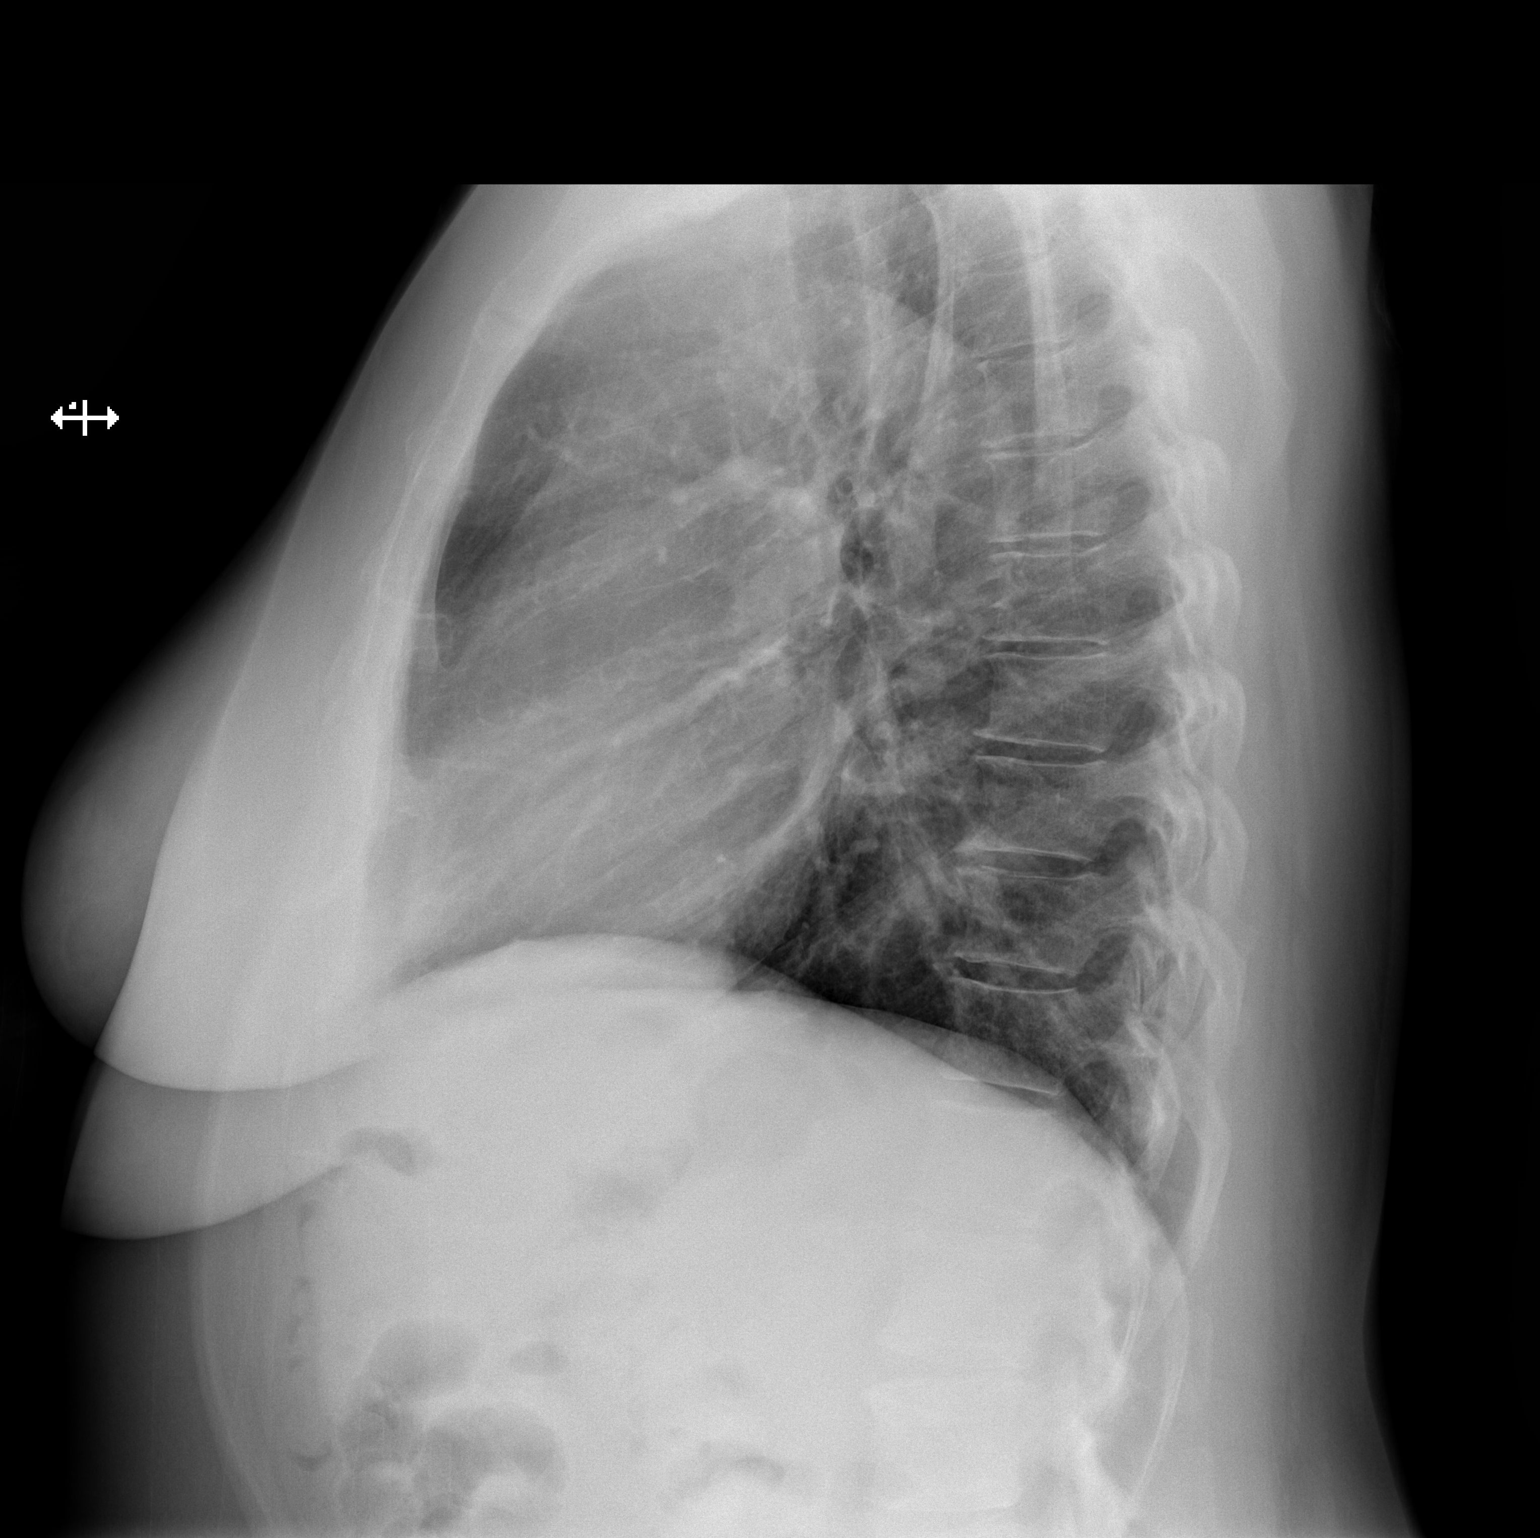

[2 of 2 positions shown; findings below may reference images not displayed]

FINDINGS: Lungs are clear. Heart size and pulmonary vascularity are normal. No
adenopathy. No pneumothorax. No bone lesions.
IMPRESSION: No edema or consolidation.

## 2020-02-22 IMAGING — DX DG KNEE 1-2V PORT*R*
2 series · 2 of 2 positions shown · non-contrast
Comparison: 01/22/2018

CLINICAL DATA: Postop right total knee replacement

EXAM:
PORTABLE RIGHT KNEE - 1-2 VIEW

[knee ap]
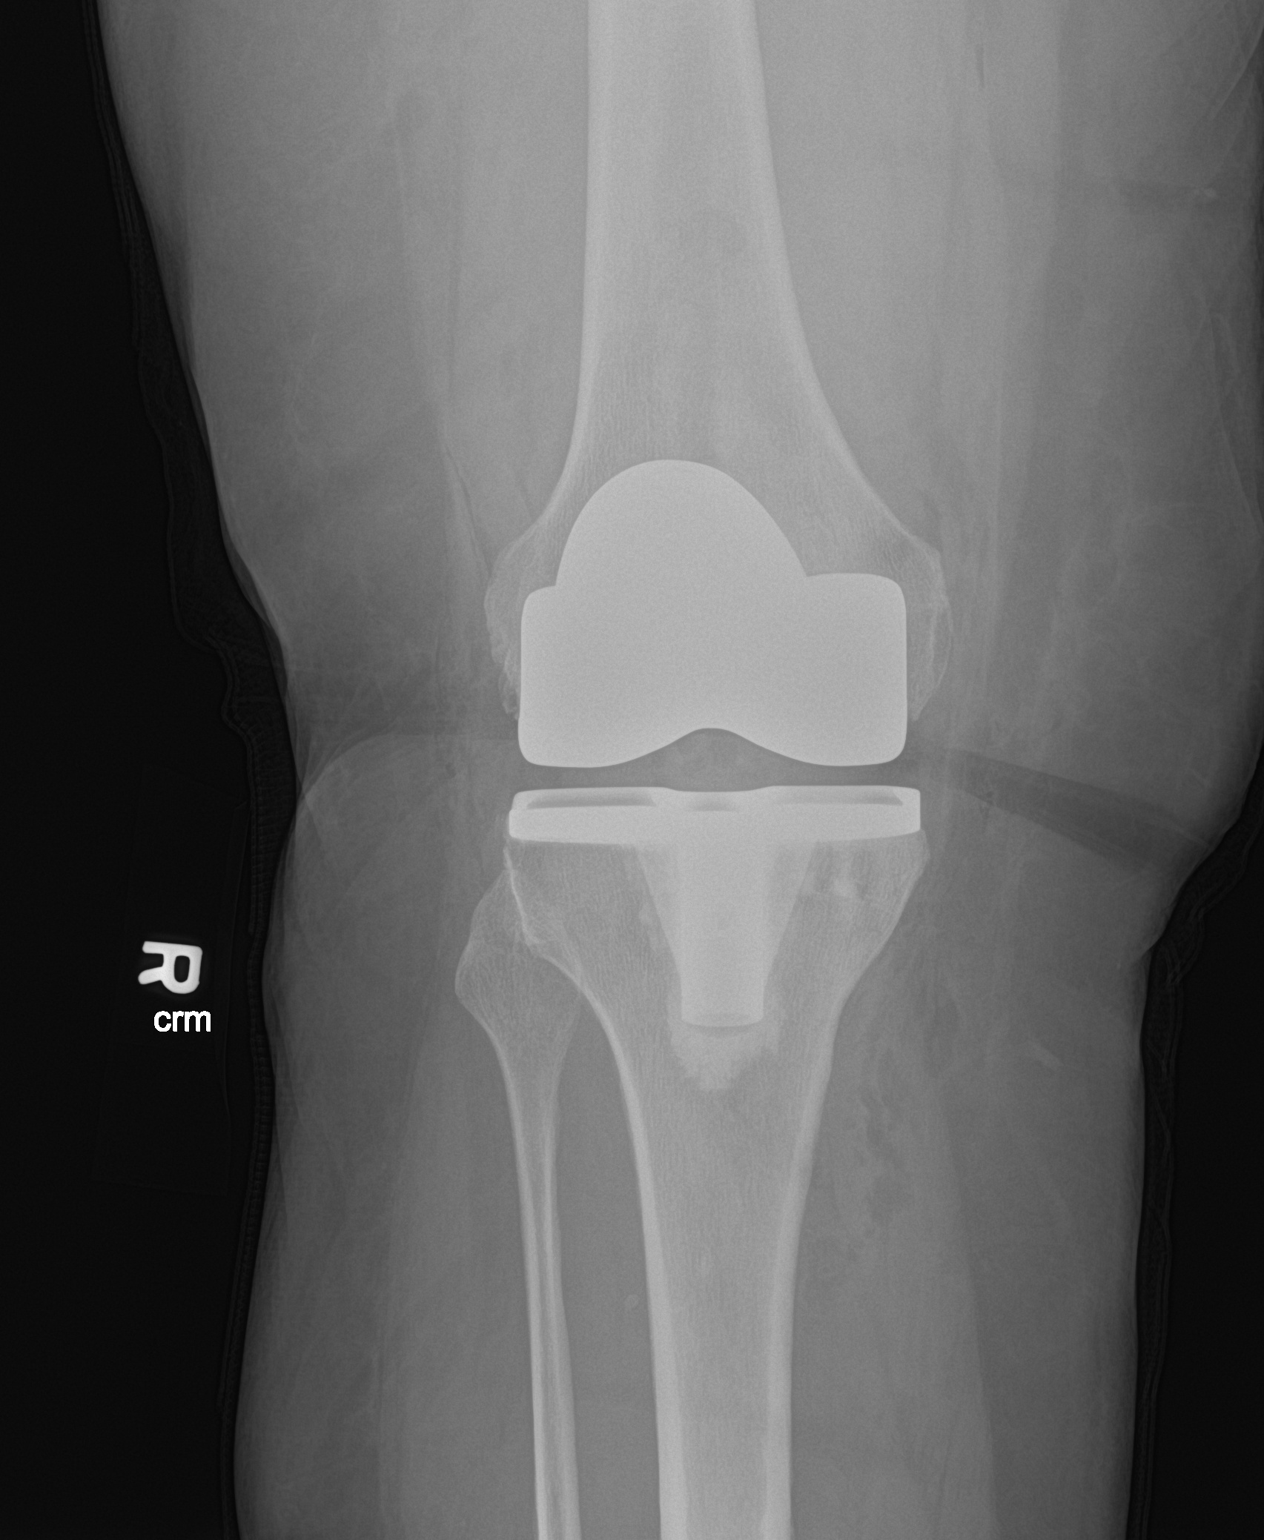

[knee lat]
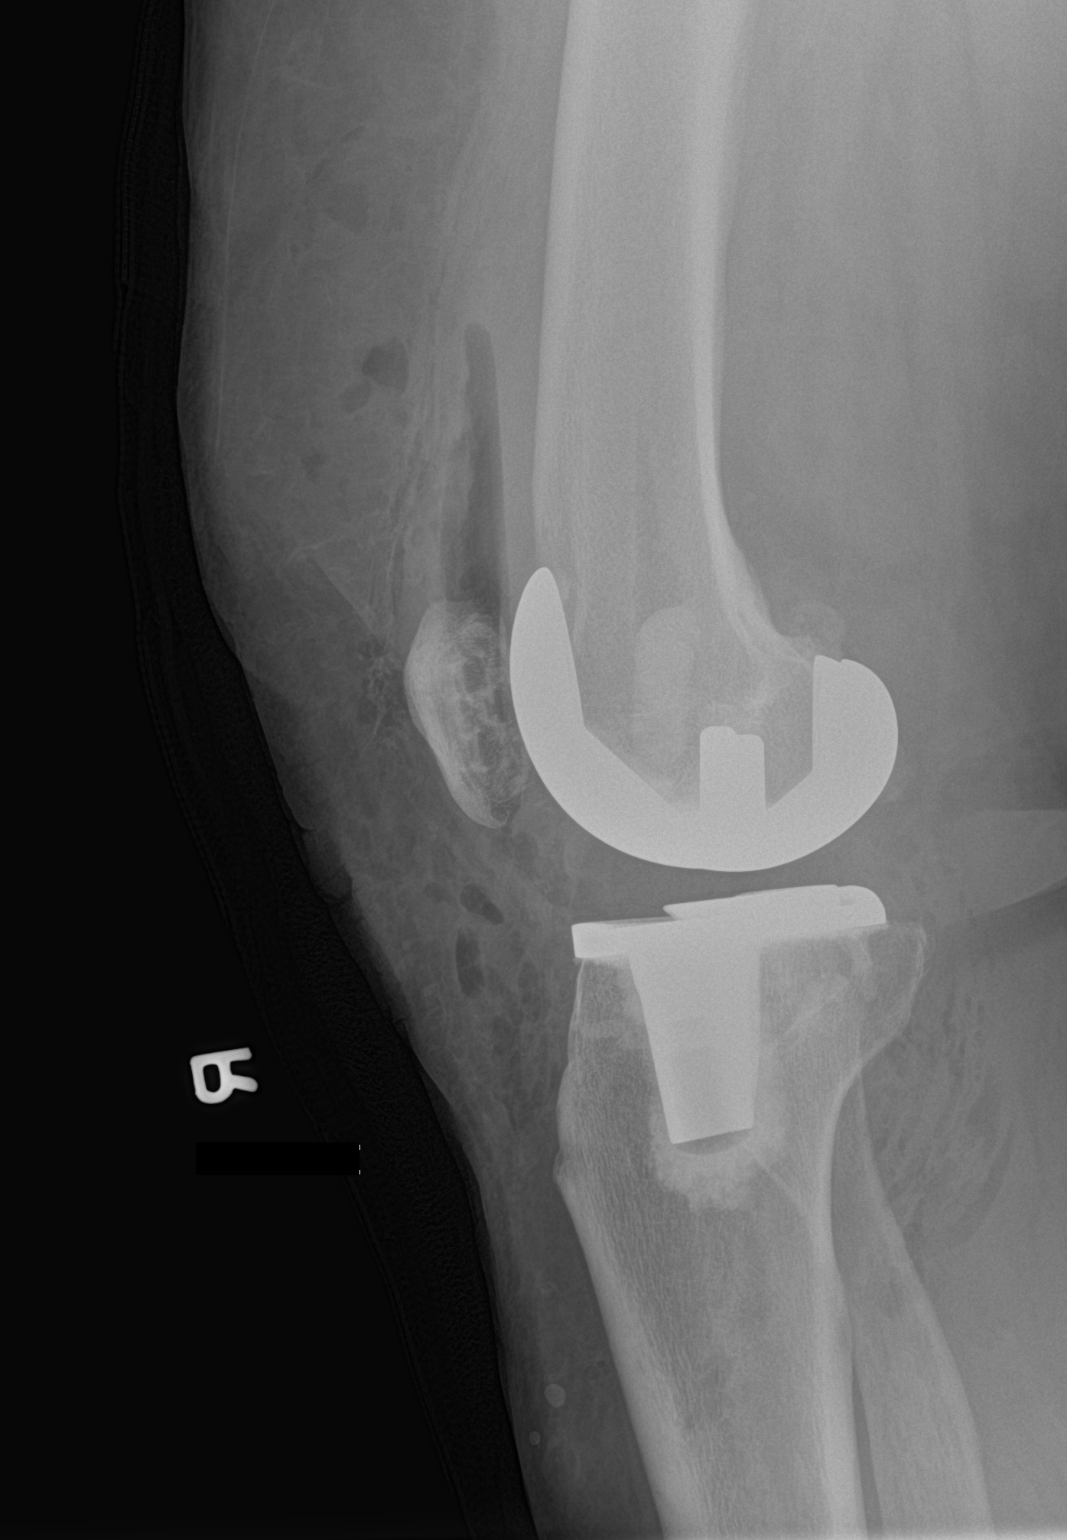

[2 of 2 positions shown; findings below may reference images not displayed]

FINDINGS: Post right total knee replacement. No evidence of hardware failure
or loosening. Alignment appears anatomic.

There is a small amount of expected intra-articular air and adjacent
subcutaneous emphysema.

Dermal calcification again overlie the proximal aspect of the
anterior lower leg. No radiopaque foreign body.
IMPRESSION: Post right total knee replacement without evidence of complication.

## 2021-03-17 ENCOUNTER — Telehealth: Payer: Self-pay | Admitting: Orthopaedic Surgery

## 2021-03-17 ENCOUNTER — Telehealth: Payer: Self-pay

## 2021-03-17 NOTE — Telephone Encounter (Signed)
yes

## 2021-03-17 NOTE — Telephone Encounter (Signed)
Noted  

## 2021-03-17 NOTE — Telephone Encounter (Signed)
Patient had a knee surgery before pandemic started. She know is having pain in the other knee. She would like to come into the office for a possible injection before going on vacation Friday 13th. Is there any way she can be worked in before then?  She is interested in receiving the hyaloronic acid injection.

## 2021-03-17 NOTE — Telephone Encounter (Signed)
Please submit for Left knee gel inj.

## 2021-03-24 ENCOUNTER — Ambulatory Visit (INDEPENDENT_AMBULATORY_CARE_PROVIDER_SITE_OTHER): Payer: 59 | Admitting: Orthopaedic Surgery

## 2021-03-24 ENCOUNTER — Other Ambulatory Visit: Payer: Self-pay

## 2021-03-24 ENCOUNTER — Encounter: Payer: Self-pay | Admitting: Orthopaedic Surgery

## 2021-03-24 ENCOUNTER — Ambulatory Visit: Payer: 59

## 2021-03-24 VITALS — Ht 67.0 in | Wt 235.0 lb

## 2021-03-24 DIAGNOSIS — M1712 Unilateral primary osteoarthritis, left knee: Secondary | ICD-10-CM

## 2021-03-24 NOTE — Progress Notes (Signed)
Office Visit Note   Patient: Kara Rodriguez           Date of Birth: 12/15/1960           MRN: [id number] Visit Date: 03/24/2021              Requested by: Donald Prose, MD [address],  [place] [address] PCP: Simona Huh, NP   Assessment & Plan: Visit Diagnoses:  1. Primary osteoarthritis of left knee     Plan: Impression is left knee degenerative joint disease.  We have discussed various treatment options to include cortisone injection for which the patient would like to proceed.  We will also go ahead and get approval for viscosupplementation injection.  She will follow-up with [place] once approved.  Call with concerns or questions in the meantime.  Follow-Up Instructions: Return for once approved for visco inj left knee.   Orders:  Orders Placed This Encounter  Procedures   Large Joint Inj: L knee   XR KNEE 3 VIEW LEFT   No orders of the defined types were placed in this encounter.     Procedures: Large Joint Inj: L knee on 03/24/2021 8:50 AM Indications: pain Details: 22 G needle, anterolateral approach Medications: 2 mL lidocaine 1 %; 2 mL bupivacaine 0.25 %; 40 mg methylPREDNISolone acetate 40 MG/ML     Clinical Data: No additional findings.   Subjective: Chief Complaint  Patient presents with   Left Knee - Pain    HPI patient is a pleasant 61 year old female who comes in today with left knee pain.  History of underlying osteoarthritis.  She is undergone cortisone and viscosupplementation injections in the past with good relief.  Her pain has returned and is primarily with activity.  She has associated popping and cracking.  She takes over-the-counter pain medication in addition to topical anti-inflammatories which do seem to help.  She is going on a 2-week cruise next Monday and would like a cortisone injection prior to leaving.  Review of Systems as detailed in HPI.  All others reviewed and are negative.   Objective: Vital Signs:  Ht 5\' 7"  (1.702 m)    Wt 235 lb (106.6 kg)    BMI 36.81 kg/m   Physical Exam well-developed well-nourished female in no acute distress.  Alert and oriented x3.  Ortho Exam left knee exam shows a small effusion.  Range of motion 0 to 100 degrees.  No joint line tenderness.  Moderate patellofemoral crepitus.  Ligaments are stable.  She is neurovascular tact distally.  Specialty Comments:  No specialty comments available.  Imaging: XR KNEE 3 VIEW LEFT  Result Date: 03/24/2021 X-rays of the left knee show moderate tricompartmental degenerative changes    PMFS History: Patient Active Problem List   Diagnosis Date Noted   S/P TKR (total knee replacement), right 03/18/2018   Bilateral primary osteoarthritis of knee 11/27/2017   Primary osteoarthritis of right knee 05/25/2017   Past Medical History:  Diagnosis Date   Bilateral primary osteoarthritis of knee 7/51/0258   Complication of anesthesia    Pt sts she has to be careful when taking pain meds because it speeds up her heart rate.   Primary localized osteoarthritis of knees, bilateral 05/25/2017   WPW (Wolff-Parkinson-White syndrome)     Family History  Problem Relation Age of Onset   Heart attack Mother 42   Non-Hodgkin's lymphoma Father    Healthy Brother    Other Maternal Grandmother  RAN AWAY   Other Maternal Grandfather        RUN OVER BY STEAM ROLLER   Other Paternal Grandmother        FAILURE TO THRIVE   Emphysema Paternal Grandfather    Other Other        MOTHER 'S PATERNAL UNCLE FROZEN TO DEATH   Cancer Son 72       TESTICULAR   Healthy Daughter    Healthy Daughter     Past Surgical History:  Procedure Laterality Date   COLONOSCOPY     DILATION AND CURETTAGE OF UTERUS     TOTAL KNEE ARTHROPLASTY Right 03/18/2018   Procedure: RIGHT TOTAL KNEE ARTHROPLASTY;  Surgeon: Leandrew Koyanagi, MD;  Location: [facility];  Service: Orthopedics;  Laterality: Right;   Social History   Occupational History   Not on file   Tobacco Use   Smoking status: Never   Smokeless tobacco: Never  Vaping Use   Vaping Use: Never used  Substance and Sexual Activity   Alcohol use: Yes    Alcohol/week: 2.0 standard drinks    Types: 2 Glasses of wine per week   Drug use: Never   Sexual activity: Not on file    Comment: MARRIED

## 2021-03-25 MED ORDER — LIDOCAINE HCL 1 % IJ SOLN
2.0000 mL | INTRAMUSCULAR | Status: AC | PRN
  Administered 2021-03-24: 2 mL

## 2021-03-25 MED ORDER — BUPIVACAINE HCL 0.25 % IJ SOLN
2.0000 mL | INTRAMUSCULAR | Status: AC | PRN
  Administered 2021-03-24: 2 mL via INTRA_ARTICULAR

## 2021-03-25 MED ORDER — METHYLPREDNISOLONE ACETATE 40 MG/ML IJ SUSP
40.0000 mg | INTRAMUSCULAR | Status: AC | PRN
  Administered 2021-03-24: 40 mg via INTRA_ARTICULAR

## 2021-04-01 ENCOUNTER — Telehealth: Payer: Self-pay

## 2021-04-01 NOTE — Telephone Encounter (Signed)
VOB submitted for Durolane, left knee. °BV pending. °

## 2021-04-04 ENCOUNTER — Telehealth: Payer: Self-pay

## 2021-04-04 NOTE — Telephone Encounter (Signed)
Called and left a Vm advising patient to CB to schedule for gel injection with Dr. Erlinda Hong.  Approved for Durolane, left knee. [place] Patient is covered at 100% of the allowable amount after through her insurance. No Co-pay No PA required

## 2021-07-11 ENCOUNTER — Telehealth: Payer: Self-pay | Admitting: Orthopaedic Surgery

## 2021-07-11 NOTE — Telephone Encounter (Signed)
Yes it is, will make her an appointment. ? ?

## 2021-07-11 NOTE — Telephone Encounter (Signed)
Patient called. She would like a gel injection in her L knee. Her call back number is [phone] ?

## 2021-07-11 NOTE — Telephone Encounter (Signed)
Just want to make sure if approval for gel inj is still valid? ? ?

## 2021-07-13 ENCOUNTER — Ambulatory Visit (INDEPENDENT_AMBULATORY_CARE_PROVIDER_SITE_OTHER): Payer: 59 | Admitting: Orthopaedic Surgery

## 2021-07-13 ENCOUNTER — Encounter: Payer: Self-pay | Admitting: Orthopaedic Surgery

## 2021-07-13 DIAGNOSIS — M1712 Unilateral primary osteoarthritis, left knee: Secondary | ICD-10-CM

## 2021-07-13 NOTE — Telephone Encounter (Signed)
Patient is requesting for the monovisc (preferably) or synvisc gel injection send it to her insurance request urgently ?

## 2021-07-14 NOTE — Telephone Encounter (Signed)
VOB submitted for Monovisc, left knee, per patient. ?BV pending ?

## 2021-07-14 NOTE — Telephone Encounter (Signed)
fyi

## 2021-07-14 NOTE — Progress Notes (Signed)
Patient refused Durolane and requested Monovisc instead.  We will try to get monovisc approved by her insurance.   ?

## 2021-07-15 ENCOUNTER — Telehealth: Payer: Self-pay | Admitting: Orthopaedic Surgery

## 2021-07-15 ENCOUNTER — Telehealth: Payer: Self-pay

## 2021-07-15 NOTE — Telephone Encounter (Signed)
Authorization has been submitted with Kathyrn Lass [employer] for Monovisc, left knee. ?Authorization is currently pending ?Pending PA# [id number] ? ? ? ?Faxed office notes to [employer] at [phone] ?

## 2021-07-18 ENCOUNTER — Telehealth: Payer: Self-pay | Admitting: Orthopaedic Surgery

## 2021-07-18 NOTE — Telephone Encounter (Signed)
Talked with patient concerning gel injection and advised her of the price per Kara Rodriguez for Monovisc due to not wanting to try the preferred products that her insurance will cover.  Patient is aware that Monovisc is not a preferred product with her insurance. ?

## 2021-07-18 NOTE — Telephone Encounter (Signed)
Pt called states that her insurance company states they will not pay for her to have monvisc gel injection in left knee. Pt states she is willing to pay out of pocket. Please call pt at [phone].  ?

## 2021-07-21 ENCOUNTER — Other Ambulatory Visit: Payer: Self-pay

## 2021-07-21 DIAGNOSIS — M1712 Unilateral primary osteoarthritis, left knee: Secondary | ICD-10-CM

## 2021-07-26 ENCOUNTER — Ambulatory Visit (INDEPENDENT_AMBULATORY_CARE_PROVIDER_SITE_OTHER): Payer: 59 | Admitting: Orthopaedic Surgery

## 2021-07-26 ENCOUNTER — Encounter: Payer: Self-pay | Admitting: Orthopaedic Surgery

## 2021-07-26 DIAGNOSIS — M1712 Unilateral primary osteoarthritis, left knee: Secondary | ICD-10-CM | POA: Diagnosis not present

## 2021-07-26 MED ORDER — LIDOCAINE HCL 1 % IJ SOLN
2.0000 mL | INTRAMUSCULAR | Status: AC | PRN
  Administered 2021-07-26: 2 mL

## 2021-07-26 MED ORDER — BUPIVACAINE HCL 0.25 % IJ SOLN
2.0000 mL | INTRAMUSCULAR | Status: AC | PRN
  Administered 2021-07-26: 2 mL via INTRA_ARTICULAR

## 2021-07-26 MED ORDER — SODIUM HYALURONATE 60 MG/3ML IX PRSY
60.0000 mg | PREFILLED_SYRINGE | INTRA_ARTICULAR | Status: AC | PRN
  Administered 2021-07-26: 60 mg via INTRA_ARTICULAR

## 2021-07-26 NOTE — Progress Notes (Signed)
? ?Office Visit Note ?  ?Patient: Kara Rodriguez           ?Date of Birth: November 10, 1960           ?MRN: [id number] ?Visit Date: 07/26/2021 ?             ?Requested by: Simona Huh, NP ?[address] ?[place],  [place] [address] ?PCP: Simona Huh, NP ? ? ?Assessment & Plan: ?Visit Diagnoses:  ?1. Unilateral primary osteoarthritis, left knee   ? ? ?Plan: Impression is left knee osteoarthritis.  Today we proceeded with left knee Durolane injection.  She tolerated this well.  Follow-up with [place] as needed. ? ?Follow-Up Instructions: Return if symptoms worsen or fail to improve.  ? ?Orders:  ?Orders Placed This Encounter  ?Procedures  ? Large Joint Inj  ? ?No orders of the defined types were placed in this encounter. ? ? ? ? Procedures: ?Large Joint Inj: L knee on 07/26/2021 9:50 AM ?Indications: pain ?Details: 22 G needle, anterolateral approach ?Medications: 2 mL lidocaine 1 %; 2 mL bupivacaine 0.25 %; 60 mg Sodium Hyaluronate 60 MG/3ML ? ? ? ? ?Clinical Data: ?No additional findings. ? ? ?Subjective: ?Chief Complaint  ?Patient presents with  ? Left Knee - Injections  ? ? ?HPI patient is a pleasant 61 year old female who comes in today for Durolane injection to the left knee.  History of underlying osteoarthritis.  She has had Durolane injections in the past which have provided nearly a years relief of pain. ? ? ? ? ?Objective: ?Vital Signs: There were no vitals taken for this visit. ? ? ? ?Ortho Exam stable left knee exam ? ?Specialty Comments:  ?No specialty comments available. ? ?Imaging: ?No new imaging ? ? ?PMFS History: ?Patient Active Problem List  ? Diagnosis Date Noted  ? S/P TKR (total knee replacement), right 03/18/2018  ? Bilateral primary osteoarthritis of knee 11/27/2017  ? Primary osteoarthritis of right knee 05/25/2017  ? ?Past Medical History:  ?Diagnosis Date  ? Bilateral primary osteoarthritis of knee 11/27/2017  ? Complication of anesthesia   ? Pt sts she has to be careful when taking pain meds because  it speeds up her heart rate.  ? Primary localized osteoarthritis of knees, bilateral 05/25/2017  ? WPW (Wolff-Parkinson-White syndrome)   ?  ?Family History  ?Problem Relation Age of Onset  ? Heart attack Mother 10  ? Non-Hodgkin's lymphoma Father   ? Healthy Brother   ? Other Maternal Grandmother   ?     RAN AWAY  ? Other Maternal Grandfather   ?     RUN OVER BY STEAM ROLLER  ? Other Paternal Grandmother   ?     FAILURE TO THRIVE  ? Emphysema Paternal Grandfather   ? Other Other   ?     MOTHER 'S PATERNAL UNCLE FROZEN TO DEATH  ? Cancer Son 20  ?     TESTICULAR  ? Healthy Daughter   ? Healthy Daughter   ?  ?Past Surgical History:  ?Procedure Laterality Date  ? COLONOSCOPY    ? DILATION AND CURETTAGE OF UTERUS    ? TOTAL KNEE ARTHROPLASTY Right 03/18/2018  ? Procedure: RIGHT TOTAL KNEE ARTHROPLASTY;  Surgeon: Leandrew Koyanagi, MD;  Location: [facility];  Service: Orthopedics;  Laterality: Right;  ? ?Social History  ? ?Occupational History  ? Not on file  ?Tobacco Use  ? Smoking status: Never  ? Smokeless tobacco: Never  ?Vaping Use  ? Vaping Use: Never used  ?  Substance and Sexual Activity  ? Alcohol use: Yes  ?  Alcohol/week: 2.0 standard drinks  ?  Types: 2 Glasses of wine per week  ? Drug use: Never  ? Sexual activity: Not on file  ?  Comment: MARRIED  ? ? ? ? ? ? ?

## 2022-03-03 ENCOUNTER — Ambulatory Visit (INDEPENDENT_AMBULATORY_CARE_PROVIDER_SITE_OTHER): Payer: Self-pay | Admitting: Orthopaedic Surgery

## 2022-03-03 DIAGNOSIS — M1712 Unilateral primary osteoarthritis, left knee: Secondary | ICD-10-CM

## 2022-03-03 MED ORDER — METHYLPREDNISOLONE ACETATE 40 MG/ML IJ SUSP
40.0000 mg | INTRAMUSCULAR | Status: AC | PRN
  Administered 2022-03-03: 40 mg via INTRA_ARTICULAR

## 2022-03-03 MED ORDER — LIDOCAINE HCL 1 % IJ SOLN
2.0000 mL | INTRAMUSCULAR | Status: AC | PRN
  Administered 2022-03-03: 2 mL

## 2022-03-03 MED ORDER — BUPIVACAINE HCL 0.5 % IJ SOLN
2.0000 mL | INTRAMUSCULAR | Status: AC | PRN
  Administered 2022-03-03: 2 mL via INTRA_ARTICULAR

## 2022-03-03 NOTE — Progress Notes (Signed)
Office Visit Note   Patient: Kara Rodriguez           Date of Birth: 1960-05-09           MRN: [id number] Visit Date: 03/03/2022              Requested by: Simona Huh, NP [place],  [place] [address] PCP: Simona Huh, NP   Assessment & Plan: Visit Diagnoses:  1. Primary osteoarthritis of left knee     Plan: Impression is left knee osteoarthritis.  Cortisone injection repeated today.  Will see her back as needed.  Follow-Up Instructions: No follow-ups on file.   Orders:  Orders Placed This Encounter  Procedures   Large Joint Inj: L knee   No orders of the defined types were placed in this encounter.     Procedures: Large Joint Inj: L knee on 03/03/2022 8:36 AM Details: 22 G needle Medications: 2 mL bupivacaine 0.5 %; 2 mL lidocaine 1 %; 40 mg methylPREDNISolone acetate 40 MG/ML Outcome: tolerated well, no immediate complications Patient was prepped and draped in the usual sterile fashion.       Clinical Data: No additional findings.   Subjective: Chief Complaint  Patient presents with   Left Knee - Follow-up    Cortisone inj    HPI Kara Rodriguez is 61 year old female who comes in for follow-up of left knee osteoarthritis.  She has developed pain recently and would like to get another cortisone shot.  The last 1 was about 7 months ago.  She is planning to go on a [place]ise [place] starting next month. Review of Systems   Objective: Vital Signs: There were no vitals taken for this visit.  Physical Exam  Ortho Exam Exam nation of the left knee shows no joint effusion.  Medial joint line tenderness. Specialty Comments:  No specialty comments available.  Imaging: No results found.   PMFS History: Patient Active Problem List   Diagnosis Date Noted   S/P TKR (total knee replacement), right 03/18/2018   Bilateral primary osteoarthritis of knee 11/27/2017   Primary osteoarthritis of right knee 05/25/2017   Past Medical  History:  Diagnosis Date   Bilateral primary osteoarthritis of knee 93/66/4403  Complication of anesthesia    Pt sts she has to be careful when taking pain meds because it speeds up her heart rate.   Primary localized osteoarthritis of knees, bilateral 05/25/2017   WPW (Wolff-Parkinson-White syndrome)     Family History  Problem Relation Age of Onset   Heart attack Mother 572  Non-Hodgkin's lymphoma Father    Healthy Brother    Other Maternal Grandmother        RAN AWAY   Other Maternal Grandfather        RUN OVER BY STEAM ROLLER   Other Paternal Grandmother        FAILURE TO THRIVE   Emphysema Paternal Grandfather    Other Other        MOTHER 'S PATERNAL UNCLE FROZEN TO DEATH   Cancer Son 226      TESTICULAR   Healthy Daughter    Healthy Daughter     Past Surgical History:  Procedure Laterality Date   COLONOSCOPY     DILATION AND CURETTAGE OF UTERUS     TOTAL KNEE ARTHROPLASTY Right 03/18/2018   Procedure: RIGHT TOTAL KNEE ARTHROPLASTY;  Surgeon: XLeandrew Koyanagi MD;  [facility]  Service: Orthopedics;  Laterality: Right;  Social History   Occupational History   Not on file  Tobacco Use   Smoking status: Never   Smokeless tobacco: Never  Vaping Use   Vaping Use: Never used  Substance and Sexual Activity   Alcohol use: Yes    Alcohol/week: 2.0 standard drinks of alcohol    Types: 2 Glasses of wine per week   Drug use: Never   Sexual activity: Not on file    Comment: MARRIED

## 2022-06-05 ENCOUNTER — Telehealth: Payer: Self-pay | Admitting: Orthopaedic Surgery

## 2022-06-05 NOTE — Telephone Encounter (Signed)
Placed CD in mail today 3/25

## 2022-06-05 NOTE — Telephone Encounter (Signed)
Received call from patient. Needs knee images on CD from 03/13/2018-present mailed to Becky Sax, MD [place] # [place]  [id number]. Auth is on file. Thank you!

## 2022-10-02 ENCOUNTER — Ambulatory Visit (INDEPENDENT_AMBULATORY_CARE_PROVIDER_SITE_OTHER): Payer: Self-pay | Admitting: Physician Assistant

## 2022-10-02 ENCOUNTER — Encounter: Payer: Self-pay | Admitting: Physician Assistant

## 2022-10-02 DIAGNOSIS — M1712 Unilateral primary osteoarthritis, left knee: Secondary | ICD-10-CM

## 2022-10-02 MED ORDER — BUPIVACAINE HCL 0.25 % IJ SOLN
2.0000 mL | INTRAMUSCULAR | Status: AC | PRN
  Administered 2022-10-02: 2 mL via INTRA_ARTICULAR

## 2022-10-02 MED ORDER — LIDOCAINE HCL 1 % IJ SOLN
2.0000 mL | INTRAMUSCULAR | Status: AC | PRN
  Administered 2022-10-02: 2 mL

## 2022-10-02 MED ORDER — METHYLPREDNISOLONE ACETATE 40 MG/ML IJ SUSP
40.0000 mg | INTRAMUSCULAR | Status: AC | PRN
  Administered 2022-10-02: 40 mg via INTRA_ARTICULAR

## 2022-10-02 NOTE — Progress Notes (Signed)
Office Visit Note   Patient: Kara Rodriguez           Date of Birth: 1960-07-30           MRN: [id number] Visit Date: 10/02/2022              Requested by: Courtney Paris, NP [address] [place],  [place] [address] PCP: Courtney Paris, NP   Assessment & Plan: Visit Diagnoses:  1. Unilateral primary osteoarthritis, left knee     Plan: Impression is left knee osteoarthritis.  Severe [place] proceeded with left knee cortisone injection.  She tolerated this well.  Follow-up as needed.  Follow-Up Instructions: Return if symptoms worsen or fail to improve.   Orders:  Orders Placed This Encounter  Procedures   Large Joint Inj: L knee   No orders of the defined types were placed in this encounter.     Procedures: Large Joint Inj: L knee on 10/02/2022 12:49 PM Indications: pain Details: 22 G needle, anterolateral approach Medications: 2 mL lidocaine 1 %; 2 mL bupivacaine 0.25 %; 40 mg methylPREDNISolone acetate 40 MG/ML      Clinical Data: No additional findings.   Subjective: Chief Complaint  Patient presents with   Left Knee - Pain    HPI patient is a pleasant 62 year old female who comes in today with recurrent left knee pain.  History of left knee osteoarthritis.  She has undergone cortisone injections in the past which have provided good but temporary relief.  Her last injection with [place] was in December 2023 but notes she underwent cortisone injection on Good Friday by physician in [place].  Her symptoms have returned.  She is requesting a repeat cortisone injection today.  Review of Systems as detailed in HPI.  All others reviewed and are negative.   Objective: Vital Signs: There were no vitals taken for this visit.  Physical Exam well-developed well-nourished female in no acute distress.  Alert oriented x 3.  Ortho Exam left knee exam is unchanged.  Specialty Comments:  No specialty comments available.  Imaging: No new imaging   PMFS  History: Patient Active Problem List   Diagnosis Date Noted   S/P TKR (total knee replacement), right 03/18/2018   Bilateral primary osteoarthritis of knee 11/27/2017   Primary osteoarthritis of right knee 05/25/2017   Past Medical History:  Diagnosis Date   Bilateral primary osteoarthritis of knee 11/27/2017   Complication of anesthesia    Pt sts she has to be careful when taking pain meds because it speeds up her heart rate.   Primary localized osteoarthritis of knees, bilateral 05/25/2017   WPW (Wolff-Parkinson-White syndrome)     Family History  Problem Relation Age of Onset   Heart attack Mother 48   Non-Hodgkin's lymphoma Father    Healthy Brother    Other Maternal Grandmother        RAN AWAY   Other Maternal Grandfather        RUN OVER BY STEAM ROLLER   Other Paternal Grandmother        FAILURE TO THRIVE   Emphysema Paternal Grandfather    Other Other        MOTHER 'S PATERNAL UNCLE FROZEN TO DEATH   Cancer Son 4       TESTICULAR   Healthy Daughter    Healthy Daughter     Past Surgical History:  Procedure Laterality Date   COLONOSCOPY     DILATION AND CURETTAGE OF UTERUS     TOTAL  KNEE ARTHROPLASTY Right 03/18/2018   Procedure: RIGHT TOTAL KNEE ARTHROPLASTY;  Surgeon: Tarry Kos, MD;  Location: [facility];  Service: Orthopedics;  Laterality: Right;   Social History   Occupational History   Not on file  Tobacco Use   Smoking status: Never   Smokeless tobacco: Never  Vaping Use   Vaping status: Never Used  Substance and Sexual Activity   Alcohol use: Yes    Alcohol/week: 2.0 standard drinks of alcohol    Types: 2 Glasses of wine per week   Drug use: Never   Sexual activity: Not on file    Comment: MARRIED

## 2023-01-03 ENCOUNTER — Encounter: Payer: Self-pay | Admitting: Orthopaedic Surgery

## 2023-01-03 ENCOUNTER — Ambulatory Visit (INDEPENDENT_AMBULATORY_CARE_PROVIDER_SITE_OTHER): Payer: Self-pay | Admitting: Orthopaedic Surgery

## 2023-01-03 DIAGNOSIS — M1712 Unilateral primary osteoarthritis, left knee: Secondary | ICD-10-CM

## 2023-01-03 MED ORDER — LIDOCAINE HCL 1 % IJ SOLN
2.0000 mL | INTRAMUSCULAR | Status: AC | PRN
  Administered 2023-01-03: 2 mL

## 2023-01-03 MED ORDER — BUPIVACAINE HCL 0.5 % IJ SOLN
2.0000 mL | INTRAMUSCULAR | Status: AC | PRN
  Administered 2023-01-03: 2 mL via INTRA_ARTICULAR

## 2023-01-03 MED ORDER — METHYLPREDNISOLONE ACETATE 40 MG/ML IJ SUSP
40.0000 mg | INTRAMUSCULAR | Status: AC | PRN
  Administered 2023-01-03: 40 mg via INTRA_ARTICULAR

## 2023-01-03 NOTE — Progress Notes (Signed)
Office Visit Note   Patient: Kara Rodriguez           Date of Birth: 09/21/1960           MRN: [id number] Visit Date: 01/03/2023              Requested by: Courtney Paris, NP [address] [place],  [place] [address] PCP: Courtney Paris, NP   Assessment & Plan: Visit Diagnoses:  1. Primary osteoarthritis of left knee     Plan: Kara Rodriguez is a 62 year old female with moderately advanced left knee DJD.  Cortisone injection repeated today.  She tolerates well.  Will see her back as needed.  Follow-Up Instructions: No follow-ups on file.   Orders:  Orders Placed This Encounter  Procedures   Large Joint Inj   No orders of the defined types were placed in this encounter.     Procedures: Large Joint Inj: L knee on 01/03/2023 10:30 AM Details: 22 G needle Medications: 2 mL bupivacaine 0.5 %; 2 mL lidocaine 1 %; 40 mg methylPREDNISolone acetate 40 MG/ML Outcome: tolerated well, no immediate complications Patient was prepped and draped in the usual sterile fashion.       Clinical Data: No additional findings.   Subjective: Chief Complaint  Patient presents with   Left Knee - Pain    HPI Kara Rodriguez returns today for left knee osteoarthritis.  Requesting another cortisone injection today.  Last injection was 3 months ago. Review of Systems   Objective: Vital Signs: There were no vitals taken for this visit.  Physical Exam  Ortho Exam Exam of the left knee is unchanged from prior visit.  No joint effusion. Specialty Comments:  No specialty comments available.  Imaging: No results found.   PMFS History: Patient Active Problem List   Diagnosis Date Noted   S/P TKR (total knee replacement), right 03/18/2018   Primary osteoarthritis of right knee 05/25/2017   Past Medical History:  Diagnosis Date   Bilateral primary osteoarthritis of knee 11/27/2017   Complication of anesthesia    Pt sts she has to be careful when taking pain meds because it speeds up her heart  rate.   Primary localized osteoarthritis of knees, bilateral 05/25/2017   WPW (Wolff-Parkinson-White syndrome)     Family History  Problem Relation Age of Onset   Heart attack Mother 21   Non-Hodgkin's lymphoma Father    Healthy Brother    Other Maternal Grandmother        RAN AWAY   Other Maternal Grandfather        RUN OVER BY STEAM ROLLER   Other Paternal Grandmother        FAILURE TO THRIVE   Emphysema Paternal Grandfather    Other Other        MOTHER 'S PATERNAL UNCLE FROZEN TO DEATH   Cancer Son 51       TESTICULAR   Healthy Daughter    Healthy Daughter     Past Surgical History:  Procedure Laterality Date   COLONOSCOPY     DILATION AND CURETTAGE OF UTERUS     TOTAL KNEE ARTHROPLASTY Right 03/18/2018   Procedure: RIGHT TOTAL KNEE ARTHROPLASTY;  Surgeon: Tarry Kos, MD;  Location: [facility];  Service: Orthopedics;  Laterality: Right;   Social History   Occupational History   Not on file  Tobacco Use   Smoking status: Never   Smokeless tobacco: Never  Vaping Use   Vaping status: Never Used  Substance and Sexual Activity  Alcohol use: Yes    Alcohol/week: 2.0 standard drinks of alcohol    Types: 2 Glasses of wine per week   Drug use: Never   Sexual activity: Not on file    Comment: MARRIED

## 2023-03-15 ENCOUNTER — Telehealth: Payer: Self-pay | Admitting: Orthopaedic Surgery

## 2023-03-15 NOTE — Telephone Encounter (Signed)
 Ok for a week early, but I wouldn't do any earlier than that

## 2023-03-15 NOTE — Telephone Encounter (Signed)
 Pt would like to know if she can get her cortisone injection for her left knee early pt is Self pay last injection was 01/03/23. Pt states knee is in great pain. Pt is having trouble resuming normal physical activity.

## 2023-03-15 NOTE — Telephone Encounter (Signed)
 Called and scheduled patient

## 2023-03-29 ENCOUNTER — Ambulatory Visit (INDEPENDENT_AMBULATORY_CARE_PROVIDER_SITE_OTHER): Payer: Self-pay | Admitting: Orthopaedic Surgery

## 2023-03-29 DIAGNOSIS — M1712 Unilateral primary osteoarthritis, left knee: Secondary | ICD-10-CM

## 2023-03-29 MED ORDER — BUPIVACAINE HCL 0.5 % IJ SOLN
2.0000 mL | INTRAMUSCULAR | Status: AC | PRN
  Administered 2023-03-29: 2 mL via INTRA_ARTICULAR

## 2023-03-29 MED ORDER — LIDOCAINE HCL 1 % IJ SOLN
2.0000 mL | INTRAMUSCULAR | Status: AC | PRN
  Administered 2023-03-29: 2 mL

## 2023-03-29 MED ORDER — METHYLPREDNISOLONE ACETATE 40 MG/ML IJ SUSP
40.0000 mg | INTRAMUSCULAR | Status: AC | PRN
  Administered 2023-03-29: 40 mg via INTRA_ARTICULAR

## 2023-03-29 NOTE — Progress Notes (Signed)
Office Visit Note   Patient: Kara Rodriguez           Date of Birth: 11-Apr-1960           MRN: [id number] Visit Date: 03/29/2023              Requested by: Courtney Paris, NP [address] [place],  [place] [address] PCP: Courtney Paris, NP   Assessment & Plan: Visit Diagnoses:  1. Primary osteoarthritis of left knee     Plan: Patient is a 63 year old female with left knee osteoarthritis.  Cortisone injection repeated today.  She tolerated this well.  Follow-up as needed.  Follow-Up Instructions: No follow-ups on file.   Orders:  Orders Placed This Encounter  Procedures   Large Joint Inj   No orders of the defined types were placed in this encounter.     Procedures: Large Joint Inj: L knee on 03/29/2023 4:46 PM Details: 22 G needle Medications: 2 mL bupivacaine 0.5 %; 2 mL lidocaine 1 %; 40 mg methylPREDNISolone acetate 40 MG/ML Outcome: tolerated well, no immediate complications Patient was prepped and draped in the usual sterile fashion.       Clinical Data: No additional findings.   Subjective: Chief Complaint  Patient presents with   Left Knee - Pain, Follow-up    HPI Patient comes back today for follow-up evaluation of left knee pain and osteoarthritis. Review of Systems   Objective: Vital Signs: There were no vitals taken for this visit.  Physical Exam  Ortho Exam Examination of the left knee is unchanged from prior visit. Specialty Comments:  No specialty comments available.  Imaging: No results found.   PMFS History: Patient Active Problem List   Diagnosis Date Noted   S/P TKR (total knee replacement), right 03/18/2018   Primary osteoarthritis of right knee 05/25/2017   Past Medical History:  Diagnosis Date   Bilateral primary osteoarthritis of knee 11/27/2017   Complication of anesthesia    Pt sts she has to be careful when taking pain meds because it speeds up her heart rate.   Primary localized osteoarthritis of knees,  bilateral 05/25/2017   WPW (Wolff-Parkinson-White syndrome)     Family History  Problem Relation Age of Onset   Heart attack Mother 10   Non-Hodgkin's lymphoma Father    Healthy Brother    Other Maternal Grandmother        RAN AWAY   Other Maternal Grandfather        RUN OVER BY STEAM ROLLER   Other Paternal Grandmother        FAILURE TO THRIVE   Emphysema Paternal Grandfather    Other Other        MOTHER 'S PATERNAL UNCLE FROZEN TO DEATH   Cancer Son 64       TESTICULAR   Healthy Daughter    Healthy Daughter     Past Surgical History:  Procedure Laterality Date   COLONOSCOPY     DILATION AND CURETTAGE OF UTERUS     TOTAL KNEE ARTHROPLASTY Right 03/18/2018   Procedure: RIGHT TOTAL KNEE ARTHROPLASTY;  Surgeon: Tarry Kos, MD;  [facility];  Service: Orthopedics;  Laterality: Right;   Social History   Occupational History   Not on file  Tobacco Use   Smoking status: Never   Smokeless tobacco: Never  Vaping Use   Vaping status: Never Used  Substance and Sexual Activity   Alcohol use: Yes    Alcohol/week: 2.0 standard drinks of alcohol  Types: 2 Glasses of wine per week   Drug use: Never   Sexual activity: Not on file    Comment: MARRIED

## 2023-05-17 ENCOUNTER — Telehealth: Payer: Self-pay | Admitting: Orthopaedic Surgery

## 2023-05-17 NOTE — Telephone Encounter (Signed)
 Please advise. Left knee cortisone injection given on 03/29/2023. Patient wants another because she will be going out of [place].

## 2023-05-17 NOTE — Telephone Encounter (Signed)
 Too soon.  sorry

## 2023-05-17 NOTE — Telephone Encounter (Signed)
 Pt called requesting a call back asking if she can have a cortisone injection. Pt last injection was 03/29/23 and states she will be out of the states for [place] months. Please call pt about this matter at [phone].

## 2023-05-18 NOTE — Telephone Encounter (Signed)
 Called and notified patient.

## 2023-07-26 ENCOUNTER — Encounter: Payer: Self-pay | Admitting: Orthopaedic Surgery

## 2023-07-26 ENCOUNTER — Telehealth: Payer: Self-pay

## 2023-07-26 ENCOUNTER — Ambulatory Visit (INDEPENDENT_AMBULATORY_CARE_PROVIDER_SITE_OTHER): Payer: Self-pay | Admitting: Orthopaedic Surgery

## 2023-07-26 DIAGNOSIS — M1712 Unilateral primary osteoarthritis, left knee: Secondary | ICD-10-CM

## 2023-07-26 MED ORDER — LIDOCAINE HCL 1 % IJ SOLN
2.0000 mL | INTRAMUSCULAR | Status: AC | PRN
  Administered 2023-07-26: 2 mL

## 2023-07-26 MED ORDER — METHYLPREDNISOLONE ACETATE 40 MG/ML IJ SUSP
40.0000 mg | INTRAMUSCULAR | Status: AC | PRN
  Administered 2023-07-26: 40 mg via INTRA_ARTICULAR

## 2023-07-26 MED ORDER — BUPIVACAINE HCL 0.5 % IJ SOLN
2.0000 mL | INTRAMUSCULAR | Status: AC | PRN
  Administered 2023-07-26: 2 mL via INTRA_ARTICULAR

## 2023-07-26 NOTE — Telephone Encounter (Signed)
 Hey April. Patient is self pay and wants to get Monovisc in her left knee. She is aware that it is super expensive and states that is not a problem for her. Please call her. Thanks!

## 2023-07-26 NOTE — Progress Notes (Signed)
 Office Visit Note   Patient: Kara Rodriguez           Date of Birth: 1960-07-18           MRN: [id number] Visit Date: 07/26/2023              Requested by: Earlis Glimpse, NP [address] [place],  [place] [address] PCP: Earlis Glimpse, NP   Assessment & Plan: Visit Diagnoses:  1. Primary osteoarthritis of left knee     Plan: History of Present Illness Kara Rodriguez is a 63 year old female who presents with left knee pain.  She experiences persistent left knee pain that affects her daily activities. Despite the pain, she was able to climb the [employer] three weeks ago, indicating some functional ability. She recently returned from a 23-night cruise with no specific incidents or injuries reported during the trip that could have worsened her knee pain.  Assessment and Plan Left knee osteoarthritis Chronic pain with stable functional capacity.  Increased pain since recent activity. - Administer cortisone injection today. - Encourage activity as tolerated. - Follow-up in 3 months or as needed.  Follow-Up Instructions: No follow-ups on file.   Orders:  No orders of the defined types were placed in this encounter.  No orders of the defined types were placed in this encounter.     Procedures: Large Joint Inj: L knee on 07/26/2023 8:32 AM Details: 22 G needle Medications: 2 mL bupivacaine  0.5 %; 2 mL lidocaine  1 %; 40 mg methylPREDNISolone  acetate 40 MG/ML Outcome: tolerated well, no immediate complications Patient was prepped and draped in the usual sterile fashion.       Clinical Data: No additional findings.   Subjective: Chief Complaint  Patient presents with   Left Knee - Follow-up    HPI  Review of Systems  Constitutional: Negative.   HENT: Negative.    Eyes: Negative.   Respiratory: Negative.    Cardiovascular: Negative.   Endocrine: Negative.   Musculoskeletal: Negative.   Neurological: Negative.   Hematological: Negative.    Psychiatric/Behavioral: Negative.    All other systems reviewed and are negative.    Objective: Vital Signs: There were no vitals taken for this visit.  Physical Exam Vitals and nursing note reviewed.  Constitutional:      Appearance: She is well-developed.  HENT:     Head: Normocephalic and atraumatic.  Pulmonary:     Effort: Pulmonary effort is normal.  Abdominal:     Palpations: Abdomen is soft.  Musculoskeletal:     Cervical back: Neck supple.  Skin:    General: Skin is warm.     Capillary Refill: Capillary refill takes less than 2 seconds.  Neurological:     Mental Status: She is alert and oriented to person, place, and time.  Psychiatric:        Behavior: Behavior normal.        Thought Content: Thought content normal.        Judgment: Judgment normal.     Ortho Exam  Specialty Comments:  No specialty comments available.  Imaging: No results found.   PMFS History: Patient Active Problem List   Diagnosis Date Noted   S/P TKR (total knee replacement), right 03/18/2018   Primary osteoarthritis of right knee 05/25/2017   Past Medical History:  Diagnosis Date   Bilateral primary osteoarthritis of knee 11/27/2017   Complication of anesthesia    Pt sts she has to be careful when taking pain meds  because it speeds up her heart rate.   Primary localized osteoarthritis of knees, bilateral 05/25/2017   WPW (Wolff-Parkinson-White syndrome)     Family History  Problem Relation Age of Onset   Heart attack Mother 32   Non-Hodgkin's lymphoma Father    Healthy Brother    Other Maternal Grandmother        RAN AWAY   Other Maternal Grandfather        RUN OVER BY STEAM ROLLER   Other Paternal Grandmother        FAILURE TO THRIVE   Emphysema Paternal Grandfather    Other Other        MOTHER 'S PATERNAL UNCLE FROZEN TO DEATH   Cancer Son 50       TESTICULAR   Healthy Daughter    Healthy Daughter     Past Surgical History:  Procedure Laterality Date    COLONOSCOPY     DILATION AND CURETTAGE OF UTERUS     TOTAL KNEE ARTHROPLASTY Right 03/18/2018   Procedure: RIGHT TOTAL KNEE ARTHROPLASTY;  Surgeon: Wes Hamman, MD;  Location[facility];  Service: Orthopedics;  Laterality: Right;   Social History   Occupational History   Not on file  Tobacco Use   Smoking status: Never   Smokeless tobacco: Never  Vaping Use   Vaping status: Never Used  Substance and Sexual Activity   Alcohol use: Yes    Alcohol/week: 2.0 standard drinks of alcohol    Types: 2 Glasses of wine per week   Drug use: Never   Sexual activity: Not on file    Comment: MARRIED

## 2023-08-07 NOTE — Telephone Encounter (Signed)
 Called and left a VM advising to give me a call back concerning gel injection for her left knee.  Did advise of [facility].

## 2023-11-29 ENCOUNTER — Encounter: Payer: Self-pay | Admitting: Orthopaedic Surgery

## 2023-11-29 ENCOUNTER — Ambulatory Visit (INDEPENDENT_AMBULATORY_CARE_PROVIDER_SITE_OTHER): Payer: Self-pay | Admitting: Orthopaedic Surgery

## 2023-11-29 DIAGNOSIS — M1712 Unilateral primary osteoarthritis, left knee: Secondary | ICD-10-CM

## 2023-11-29 MED ORDER — LIDOCAINE HCL 1 % IJ SOLN
2.0000 mL | INTRAMUSCULAR | Status: AC | PRN
  Administered 2023-11-29: 2 mL

## 2023-11-29 MED ORDER — BUPIVACAINE HCL 0.5 % IJ SOLN
2.0000 mL | INTRAMUSCULAR | Status: AC | PRN
  Administered 2023-11-29: 2 mL via INTRA_ARTICULAR

## 2023-11-29 MED ORDER — METHYLPREDNISOLONE ACETATE 40 MG/ML IJ SUSP
40.0000 mg | INTRAMUSCULAR | Status: AC | PRN
  Administered 2023-11-29: 40 mg via INTRA_ARTICULAR

## 2023-11-29 NOTE — Progress Notes (Signed)
   Office Visit Note   Patient: Kara Rodriguez           Date of Birth: [birth year]           MRN: [id number] Visit Date: 11/29/2023              Requested by: Leron Millman, NP [address] [place],  [place] [address] PCP: Leron Millman, NP   Assessment & Plan: Visit Diagnoses:  1. Primary osteoarthritis of left knee     Plan: History of Present Illness Kara Rodriguez is a 63 year old female who presents with follow-up evaluation of left knee osteoarthritis.  Requesting another cortisone injection.  These have been effective for months.  She is experiencing issues with her left knee.  She has previously undergone a knee replacement on the right knee, which was successful despite a challenging recovery. She describes the recovery process as difficult, with significant emotional and physical challenges. She is currently waiting to have her left knee addressed once she is eligible for Medicaid or Medicare.  Left knee exam is unchanged from prior visit.  Assessment and Plan Left knee osteoarthritis.  Administered her cortisone injection today.  She tolerated this well.  Will see her back as needed.  Follow-Up Instructions: No follow-ups on file.   Orders:  No orders of the defined types were placed in this encounter.  No orders of the defined types were placed in this encounter.     Procedures: Large Joint Inj: L knee on 11/29/2023 2:04 PM Details: 22 G needle Medications: 2 mL bupivacaine  0.5 %; 2 mL lidocaine  1 %; 40 mg methylPREDNISolone  acetate 40 MG/ML Outcome: tolerated well, no immediate complications Patient was prepped and draped in the usual sterile fashion.       Clinical Data: No additional findings.   Subjective: Chief Complaint  Patient presents with   Left Knee - Pain     Social History   Occupational History   Not on file  Tobacco Use   Smoking status: Never   Smokeless tobacco: Never  Vaping Use   Vaping status: Never Used  Substance and  Sexual Activity   Alcohol use: Yes    Alcohol/week: 2.0 standard drinks of alcohol    Types: 2 Glasses of wine per week   Drug use: Never   Sexual activity: Not on file    Comment: MARRIED

## 2024-01-14 ENCOUNTER — Encounter: Payer: Self-pay | Admitting: Radiology
